# Patient Record
Sex: Female | Born: 1965
Health system: Southern US, Community
[De-identification: ages and names within clinical notes are randomized; demographics above are authoritative.]

## PROBLEM LIST (undated history)

## (undated) DIAGNOSIS — K219 Gastro-esophageal reflux disease without esophagitis: Secondary | ICD-10-CM

## (undated) DIAGNOSIS — N390 Urinary tract infection, site not specified: Secondary | ICD-10-CM

## (undated) DIAGNOSIS — I499 Cardiac arrhythmia, unspecified: Secondary | ICD-10-CM

## (undated) DIAGNOSIS — T7840XA Allergy, unspecified, initial encounter: Secondary | ICD-10-CM

## (undated) DIAGNOSIS — J309 Allergic rhinitis, unspecified: Secondary | ICD-10-CM

## (undated) DIAGNOSIS — I10 Essential (primary) hypertension: Secondary | ICD-10-CM

## (undated) DIAGNOSIS — B019 Varicella without complication: Secondary | ICD-10-CM

## (undated) HISTORY — DX: Urinary tract infection, site not specified: N39.0

## (undated) HISTORY — DX: Allergic rhinitis, unspecified: J30.9

## (undated) HISTORY — DX: Cardiac arrhythmia, unspecified: I49.9

## (undated) HISTORY — DX: Gastro-esophageal reflux disease without esophagitis: K21.9

## (undated) HISTORY — DX: Varicella without complication: B01.9

## (undated) HISTORY — DX: Allergy, unspecified, initial encounter: T78.40XA

## (undated) HISTORY — DX: Essential (primary) hypertension: I10

---
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
## 2010-12-27 DIAGNOSIS — I499 Cardiac arrhythmia, unspecified: Secondary | ICD-10-CM

## 2010-12-27 HISTORY — DX: Cardiac arrhythmia, unspecified: I49.9

## 2011-09-23 ENCOUNTER — Ambulatory Visit: Payer: Self-pay

## 2012-08-30 HISTORY — PX: INTRAUTERINE DEVICE (IUD) INSERTION: SHX5877

## 2015-12-28 HISTORY — PX: BREAST BIOPSY: SHX20

## 2016-04-26 ENCOUNTER — Ambulatory Visit
Admission: RE | Admit: 2016-04-26 | Discharge: 2016-04-26 | Disposition: A | Discharge status: Home / Self Care | Payer: BLUE CROSS/BLUE SHIELD | Source: Ambulatory Visit | Attending: Family Medicine | Admitting: Family Medicine

## 2016-04-26 ENCOUNTER — Other Ambulatory Visit: Payer: Self-pay | Admitting: Family Medicine

## 2016-04-26 DIAGNOSIS — R062 Wheezing: Secondary | ICD-10-CM

## 2016-04-26 DIAGNOSIS — R05 Cough: Secondary | ICD-10-CM

## 2016-04-26 DIAGNOSIS — R059 Cough, unspecified: Secondary | ICD-10-CM

## 2016-04-26 DIAGNOSIS — R0602 Shortness of breath: Secondary | ICD-10-CM

## 2016-04-26 DIAGNOSIS — R918 Other nonspecific abnormal finding of lung field: Secondary | ICD-10-CM | POA: Insufficient documentation

## 2016-04-29 ENCOUNTER — Ambulatory Visit: Payer: BLUE CROSS/BLUE SHIELD | Admitting: Family Medicine

## 2016-04-30 ENCOUNTER — Encounter: Payer: Self-pay | Admitting: Family Medicine

## 2016-04-30 ENCOUNTER — Ambulatory Visit (INDEPENDENT_AMBULATORY_CARE_PROVIDER_SITE_OTHER): Payer: BLUE CROSS/BLUE SHIELD | Admitting: Family Medicine

## 2016-04-30 VITALS — BP 124/92 | HR 81 | Temp 98.5°F | Ht 65.25 in | Wt 168.2 lb

## 2016-04-30 DIAGNOSIS — J189 Pneumonia, unspecified organism: Secondary | ICD-10-CM | POA: Diagnosis not present

## 2016-04-30 MED ORDER — BECLOMETHASONE DIPROPIONATE 80 MCG/ACT IN AERS: 1.0000 | INHALATION_SPRAY | Freq: Two times a day (BID) | RESPIRATORY_TRACT | Status: DC

## 2016-04-30 MED ORDER — NITROFURANTOIN MONOHYD MACRO 100 MG PO CAPS: ORAL_CAPSULE | ORAL | Status: DC

## 2016-04-30 MED ORDER — AZITHROMYCIN 250 MG PO TABS: ORAL_TABLET | ORAL | Status: DC

## 2016-04-30 NOTE — Assessment & Plan Note (Signed)
New problem. Unclear etiology. Suspect viral illness. Given lack of improvement, starting on inhaled corticosteroid. Rx for Qvar sent. If fails to improve, she can start course of Azithromycin. If persists will need PFT's and possible Pulm referral.

## 2016-04-30 NOTE — Patient Instructions (Signed)
Use the inhaler as prescribed.  If you fail to improve within the next week take the antibiotic.  Call if you worsen or fail to improve.  Follow up annually or sooner if needed.  Take care  Dr. Lacinda Axon  Pneumonitis Pneumonitis is inflammation of the lungs.  CAUSES  Many things can cause pneumonitis. These can include:   A bacterial or viral infection. Pneumonitis due to an infection is usually called pneumonia.  Work-related exposures, including farm and industrial work. Some substances that can cause pneumonitis include asbestos, silica, inhaled acids, or inhaled chlorine gas.   Repeated exposure to bird feathers, bird feces, or other allergens.   Medicine such as chemotherapy drugs, certain antibiotics, and some heart medicines.   Radiation therapy.   Exposure to mold. A hot tub, sauna, or home humidifier can have mold growing in it, even if it looks clean. The mold can be breathed in through water vapor.  Breathing (aspirating) stomach contents, food, or liquids into the lungs.  SIGNS AND SYMPTOMS   Cough.   Shortness of breath or difficulty breathing.   Fever.   Decreased energy.   Decreased appetite.  DIAGNOSIS  To diagnose pneumonitis, your health care provider will do a complete history and physical exam. Various tests may be ordered, such as:   Pulmonary function test.   Chest X-ray.   CT scan of the lungs.   Bronchoscopy.   Lung biopsy.  TREATMENT  Treatment will depend on the cause of the pneumonitis. If the cause is exposure to a substance, avoiding further exposure to that substance will help reduce your symptoms. Possible medical treatments for pneumonitis include:   Corticosteroid medicine to help decrease inflammation in the lungs.   Antibiotic medicine to help fight a bacterial lung infection.   Oxygen therapy if you are having difficulty breathing.  HOME CARE INSTRUCTIONS   Avoid exposure to any substance identified as the  cause of your pneumonitis.   If you must continue to work with substances that can cause pneumonitis, wear a mask to protect your lungs.   Only take over-the-counter or prescription medicine as directed by your health care provider.   Do not smoke.   If you use inhalers, keep them with you at all times.   Follow up with your health care provider as directed.  SEEK IMMEDIATE MEDICAL CARE IF:   You develop new or increased shortness of breath.   You develop a blue color (cyanosis) under your fingernails.   You have a fever.  MAKE SURE YOU:   Understand these instructions.  Will watch your condition.  Will get help right away if you are not doing well or get worse.   This information is not intended to replace advice given to you by your health care provider. Make sure you discuss any questions you have with your health care provider.   Document Released: 06/02/2010 Document Revised: 08/15/2013 Document Reviewed: 06/04/2013 Elsevier Interactive Patient Education [employer].

## 2016-04-30 NOTE — Progress Notes (Signed)
Pre visit review using our clinic review tool, if applicable. No additional management support is needed unless otherwise documented below in the visit note. 

## 2016-04-30 NOTE — Progress Notes (Signed)
Subjective:  Patient ID: Carol Jordan, female    DOB: 1966/10/09  Age: 50 y.o. MRN: [id number]  CC: Cough/pneumonitis  HPI Carol Jordan is a 50 y.o. female presents to the clinic today with the above complaints.   Cough  Patient reports that she has not felt well for the past 2 and half weeks.  She has been experiencing persistent, dry cough.  She reports associated shortness of breath.  Denies fever.  She has been seen at her work and has been treated with prednisone, Augmentin, albuterol, and Tussionex.  Additionally, she's recently had a chest x-ray which revealed pneumonitis.  She presented today with complaints of continued cough. She states that the cough is severe and unrelenting. She's had no relief with the above treatment.  No known exacerbating factors.  No other complaints at this time.  PMH, Surgical Hx, Family Hx, Social History reviewed and updated as below.  Past Medical History  Diagnosis Date  . Chicken pox   . UTI (urinary tract infection)   . Allergy   . GERD (gastroesophageal reflux disease)    Past Surgical History  Procedure Laterality Date  . Hernia repair  1972   Family History  Problem Relation Age of Onset  . Heart disease Mother   . Hypertension Mother   . Stroke Maternal Grandmother    Social History  Substance Use Topics  . Smoking status: Never Smoker   . Smokeless tobacco: Never Used  . Alcohol Use: 0.0 - 0.6 oz/week    0-1 Standard drinks or equivalent per week   Review of Systems  Respiratory: Positive for cough, chest tightness and shortness of breath.   Genitourinary:       Incontinence.  All other systems reviewed and are negative.  Objective:   Today's Vitals: BP 124/92 mmHg  Pulse 81  Temp(Src) 98.5 F (36.9 C) (Oral)  Ht 5' 5.25" (1.657 m)  Wt 168 lb 4 oz (76.318 kg)  BMI 27.80 kg/m2  SpO2 92%  LMP 04/25/2016  Physical Exam  Constitutional: She is oriented to person, place, and time. She appears  well-developed. No distress.  HENT:  Head: Normocephalic and atraumatic.  Mouth/Throat: Oropharynx is clear and moist. No oropharyngeal exudate.  Normal TM's bilaterally.   Eyes: Conjunctivae are normal. No scleral icterus.  Neck: Neck supple.  Cardiovascular: Normal rate and regular rhythm.   No murmur heard. Pulmonary/Chest: Effort normal.  Diffuse wheezing noted. Crackles also noted.  Abdominal: Soft. She exhibits no distension.  Musculoskeletal: Normal range of motion. She exhibits no edema.  Lymphadenopathy:    She has no cervical adenopathy.  Neurological: She is alert and oriented to person, place, and time.  Skin: No rash noted.  Scar noted on left knee. Chest - hypertrophic scar vs cheloid.   Psychiatric: She has a normal mood and affect.  Vitals reviewed.  Assessment & Plan:   Problem List Items Addressed This Visit    Pneumonitis - Primary    New problem. Unclear etiology. Suspect viral illness. Given lack of improvement, starting on inhaled corticosteroid. Rx for Qvar sent. If fails to improve, she can start course of Azithromycin. If persists will need PFT's and possible Pulm referral.        Outpatient Encounter Prescriptions as of 04/30/2016  Medication Sig  . nitrofurantoin, macrocrystal-monohydrate, (MACROBID) 100 MG capsule PRN after intercourse.  . [DISCONTINUED] Fexofenadine HCl (ALLEGRA PO) Take by mouth.  . [DISCONTINUED] Nitrofurantoin Monohyd Macro (MACROBID PO) Take by mouth as needed.  Marland Kitchen  azithromycin (ZITHROMAX) 250 MG tablet 2 tablets on day 1; then 1 tablet daily on days 2-5.  . beclomethasone (QVAR) 80 MCG/ACT inhaler Inhale 1 puff into the lungs 2 (two) times daily.  . fexofenadine (ALLEGRA) 180 MG tablet TAKE 1 TABLET (180 MG) BY ORAL ROUTE ONCE DAILY  . predniSONE (STERAPRED UNI-PAK 21 TAB) 10 MG (21) TBPK tablet See admin instructions.  Marland Kitchen PROAIR HFA 108 (90 Base) MCG/ACT inhaler INHALE 2 PUFFS (180 MCG) BY INHALATION ROUTE EVERY 4-6 HOURS AS  NEEDED   No facility-administered encounter medications on file as of 04/30/2016.   Follow-up: Annually or sooner if needed  [place]

## 2016-05-17 ENCOUNTER — Telehealth: Payer: Self-pay | Admitting: Family Medicine

## 2016-05-17 ENCOUNTER — Ambulatory Visit (INDEPENDENT_AMBULATORY_CARE_PROVIDER_SITE_OTHER): Payer: BLUE CROSS/BLUE SHIELD | Admitting: Family Medicine

## 2016-05-17 ENCOUNTER — Encounter: Payer: Self-pay | Admitting: Family Medicine

## 2016-05-17 VITALS — BP 142/98 | HR 93 | Temp 98.4°F | Ht 65.5 in | Wt 169.4 lb

## 2016-05-17 DIAGNOSIS — R053 Chronic cough: Secondary | ICD-10-CM

## 2016-05-17 DIAGNOSIS — T148 Other injury of unspecified body region: Secondary | ICD-10-CM

## 2016-05-17 DIAGNOSIS — R05 Cough: Secondary | ICD-10-CM | POA: Diagnosis not present

## 2016-05-17 DIAGNOSIS — W57XXXA Bitten or stung by nonvenomous insect and other nonvenomous arthropods, initial encounter: Secondary | ICD-10-CM | POA: Diagnosis not present

## 2016-05-17 MED ORDER — DOXYCYCLINE HYCLATE 100 MG PO TABS: 100.0000 mg | ORAL_TABLET | Freq: Two times a day (BID) | ORAL | Status: DC

## 2016-05-17 NOTE — Patient Instructions (Signed)
Take the doxy as prescribed.  We will call with your referral to pulm.  Take care  Dr. Lacinda Axon

## 2016-05-17 NOTE — Telephone Encounter (Signed)
[facility] Patient Name: [name] DOB: 02-16-1966 Initial Comment Caller states she has a tick bite on back of neck, got it mostly out, still has a piece in. Also still coughing after pneumonitis. Has coughing after eating. Nurse Assessment Nurse: Dimas Chyle, RN, Dellis Filbert Date/Time Eilene Ghazi Time): 05/17/2016 9:20:51 AM Confirm and document reason for call. If symptomatic, describe symptoms. You must click the next button to save text entered. ---Caller states she has a tick bite on back of neck, got it mostly out, still has a piece in. Also still coughing after pneumonitis. Has coughing after eating. Has had pneumonitis for 4 weeks. Has the patient traveled out of the country within the last 30 days? ---No Does the patient have any new or worsening symptoms? ---Yes Will a triage be completed? ---Yes Related visit to physician within the last 2 weeks? ---Yes Does the PT have any chronic conditions? (i.e. diabetes, asthma, etc.) ---No Is the patient pregnant or possibly pregnant? (Ask all females between the ages of 43-55) ---No Is this a behavioral health or substance abuse call? ---No Guidelines Guideline Title Affirmed Question Affirmed Notes Tick Bite Can't remove tick's head that was broken off in the skin (after trying Care Advice) Pneumonia on Antibiotic Post- Hospitalization Follow-up Call [1] Finished taking antibiotics AND [2] breathing not back to normal (e.g., "breathing still worse than normal") Final Disposition User See Physician within [place], RN, Dellis Filbert Comments Caller was going to call back if unable to get nurse at work to get piece from tick bite out. Referrals REFERRED TO PCP OFFICE Disagree/Comply: Comply Disagree/Comply: Comply

## 2016-05-17 NOTE — Telephone Encounter (Signed)
Patient to comply and appointment scheduled for today with Dr. Lacinda Axon.

## 2016-05-17 NOTE — Assessment & Plan Note (Signed)
New problem. Retained portion of tick removed today. Area was injected with 0.5 mL of lidocaine w/ epi. Scalpel and forceps used to remove retained portion. Given above, treating with Doxy.

## 2016-05-17 NOTE — Progress Notes (Signed)
Pre visit review using our clinic review tool, if applicable. No additional management support is needed unless otherwise documented below in the visit note. 

## 2016-05-17 NOTE — Assessment & Plan Note (Signed)
Patient now with chronic cough. Etiology unclear. Sending to pulm for PFT's and eval.

## 2016-05-17 NOTE — Progress Notes (Signed)
   Subjective:  Patient ID: Carol Jordan, female    DOB: 1966-04-22  Age: 50 y.o. MRN: [id number]  CC: Tick bite, Cough  HPI:  50 year old female presents with the above complaints.  Tick bite  Discovered last night.  Was removed. She feels like there is still a portion in her skin.  No fever, chills. No rash.  Was bit on left upper chest (above clavicle).  No known exacerbating factors.  Cough  Previously seen for this.  Has been treated with 2 rounds of antibiotics as well as Albuterol, Tussionex, QVAR.   Continues to persist.  She has had no improvement/resolution.  No SOB.  No reports of GERD but does report a remote history of this.  No other associated symptoms.  Social Hx   Social History   Social History  . Marital Status: Single    Spouse Name: N/A  . Number of Children: N/A  . Years of Education: N/A   Social History Main Topics  . Smoking status: Never Smoker   . Smokeless tobacco: Never Used  . Alcohol Use: 0.0 - 0.6 oz/week    0-1 Standard drinks or equivalent per week  . Drug Use: None  . Sexual Activity: Not Asked   Other Topics Concern  . None   Social History Narrative   Review of Systems  Constitutional: Negative for fever.  Respiratory: Positive for cough.   Skin:       Tick bite.    Objective:  BP 142/98 mmHg  Pulse 93  Temp(Src) 98.4 F (36.9 C) (Oral)  Ht 5' 5.5" (1.664 m)  Wt 169 lb 6.4 oz (76.839 kg)  BMI 27.75 kg/m2  SpO2 94%  LMP 04/25/2016  BP/Weight 99991111 [id number]  Systolic BP A999333 [id number]  Diastolic BP 98 92  Wt. (Lbs) 169.4 168.25  BMI 27.75 27.8    Physical Exam  Constitutional: She is oriented to person, place, and time. She appears well-developed. No distress.  Cardiovascular: Normal rate and regular rhythm.   Pulmonary/Chest: Effort normal. No respiratory distress. She has no wheezes. She has no rales.  Neurological: She is alert and oriented to person, place, and time.  Skin:  Small area with  dark center; appears to be a retained portion of tick. No surrounding erythema. No drainage.  Psychiatric: She has a normal mood and affect.  Vitals reviewed.   Assessment & Plan:   Problem List Items Addressed This Visit    Tick bite - Primary    New problem. Retained portion of tick removed today. Area was injected with 0.5 mL of lidocaine w/ epi. Scalpel and forceps used to remove retained portion. Given above, treating with Doxy.      Chronic cough    Patient now with chronic cough. Etiology unclear. Sending to pulm for PFT's and eval.      Relevant Orders   Ambulatory referral to Pulmonology      Meds ordered this encounter  Medications  . doxycycline (VIBRA-TABS) 100 MG tablet    Sig: Take 1 tablet (100 mg total) by mouth 2 (two) times daily.    Dispense:  14 tablet    Refill:  0    Follow-up: PRN  [place]

## 2016-05-18 ENCOUNTER — Encounter: Payer: Self-pay | Admitting: Pulmonary Disease

## 2016-05-20 ENCOUNTER — Ambulatory Visit: Payer: Self-pay | Admitting: Family Medicine

## 2016-05-31 ENCOUNTER — Encounter (INDEPENDENT_AMBULATORY_CARE_PROVIDER_SITE_OTHER): Payer: Self-pay

## 2016-05-31 ENCOUNTER — Ambulatory Visit (INDEPENDENT_AMBULATORY_CARE_PROVIDER_SITE_OTHER): Payer: BLUE CROSS/BLUE SHIELD | Admitting: Pulmonary Disease

## 2016-05-31 ENCOUNTER — Encounter: Payer: Self-pay | Admitting: Pulmonary Disease

## 2016-05-31 VITALS — BP 126/74 | HR 90 | Ht 65.5 in | Wt 168.0 lb

## 2016-05-31 DIAGNOSIS — J387 Other diseases of larynx: Secondary | ICD-10-CM | POA: Diagnosis not present

## 2016-05-31 DIAGNOSIS — R05 Cough: Secondary | ICD-10-CM

## 2016-05-31 DIAGNOSIS — J302 Other seasonal allergic rhinitis: Secondary | ICD-10-CM | POA: Diagnosis not present

## 2016-05-31 DIAGNOSIS — J45909 Unspecified asthma, uncomplicated: Secondary | ICD-10-CM

## 2016-05-31 DIAGNOSIS — K219 Gastro-esophageal reflux disease without esophagitis: Secondary | ICD-10-CM

## 2016-05-31 DIAGNOSIS — R059 Cough, unspecified: Secondary | ICD-10-CM

## 2016-05-31 MED ORDER — BUDESONIDE-FORMOTEROL FUMARATE 160-4.5 MCG/ACT IN AERO: 2.0000 | INHALATION_SPRAY | Freq: Two times a day (BID) | RESPIRATORY_TRACT | Status: DC

## 2016-05-31 MED ORDER — PANTOPRAZOLE SODIUM 40 MG PO TBEC: 40.0000 mg | DELAYED_RELEASE_TABLET | Freq: Every day | ORAL | Status: DC

## 2016-06-01 NOTE — Progress Notes (Signed)
PULMONARY CONSULT NOTE  Requesting MD/Service: Thersa Salt, MD Date of initial consultation: 05/31/16 Reason for consultation: cough  PT PROFILE: 50 y.o. F for subacute cough due to rhinitis, suspected LPR and possible asthma  HPI:  35 F referred byDr Lacinda Axon for evaluation of cough X 2-3 months duration. At the onset of this problem she was diagnosed with a "sinus infection" when she presented with sinus pressure, ST, HA and hoarseness. She was treated with antibiotics and prednisone with improvement in most symptoms but persistent cough. She has subsequently been started on Qvar inhaler. Presently, she reports persistent daily cough which is nonproductive. She is not able to identify any specific triggers. She also notes a hoarse voice qulaity. She has no prior dx of asthma. The Qvar has been modestly beneficial at most. She notes a prior history of seasonal allergies. She denies CP, fever, purulent sputum, hemoptysis, LE edema and calf tenderness.   Past Medical History  Diagnosis Date  . Chicken pox   . UTI (urinary tract infection)   . Allergy   . GERD (gastroesophageal reflux disease)     Past Surgical History  Procedure Laterality Date  . Hernia repair  1972    MEDICATIONS: I have reviewed all medications and confirmed regimen as documented  Social History   Social History  . Marital Status: Single    Spouse Name: N/A  . Number of Children: N/A  . Years of Education: N/A   Occupational History  . Not on file.   Social History Main Topics  . Smoking status: Never Smoker   . Smokeless tobacco: Never Used  . Alcohol Use: 0.0 - 0.6 oz/week    0-1 Standard drinks or equivalent per week  . Drug Use: No  . Sexual Activity: Not on file   Other Topics Concern  . Not on file   Social History Narrative    Family History  Problem Relation Age of Onset  . Heart disease Mother   . Hypertension Mother   . Stroke Maternal Grandmother     ROS: No fever,  myalgias/arthralgias, unexplained weight loss or weight gain No new focal weakness or sensory deficits No otalgia, hearing loss, visual changes, nasal and sinus symptoms, mouth and throat problems No neck pain or adenopathy No abdominal pain, N/V/D, diarrhea, change in bowel pattern No dysuria, change in urinary pattern   Filed Vitals:   05/31/16 1031  BP: 126/74  Pulse: 90  Height: 5' 5.5" (1.664 m)  Weight: 168 lb (76.204 kg)  SpO2: 98%     EXAM:  Gen: WDWN, No overt respiratory distress HEENT: NCAT, TMs and canals normal, sclera white, nares and nasal mucosa mildly normal, oropharynx normal Neck: Supple without LAN, thyromegaly, JVD Lungs: breath sounds full, percussion note, No adventitious sounds Cardiovascular: Normal rate, reg rhythm, no murmurs noted Abdomen: Soft, nontender, normal BS Ext: without clubbing, cyanosis, edema Neuro: CNs grossly intact, motor and sensory intact Skin: Limited exam, no lesions noted  DATA:   No flowsheet data found.  No flowsheet data found.  CXR (04/26/16): official interpretation: mild interstitial prominence. My interpretation: normal    IMPRESSION:     ICD-9-CM ICD-10-CM   1. Cough 786.2 R05 Pulmonary function test  2. Seasonal allergies 477.9 J30.2   3. LPR, suspected 478.79 J38.7   4. Asthma, possible 493.90 J45.909 Pulmonary function test     PLAN:  1) change Qvar to Symbicort 2) Trial of PPI - pantoprazole 40 mg daily 3) Continue Allegra daily 4) ROV  4-6 weeks with PFTs prior  Merton Border, MD PCCM service Mobile [phone] Pager [phone] 06/01/2016

## 2016-06-17 ENCOUNTER — Other Ambulatory Visit: Payer: Self-pay | Admitting: Certified Nurse Midwife

## 2016-06-17 DIAGNOSIS — N6489 Other specified disorders of breast: Secondary | ICD-10-CM

## 2016-06-22 ENCOUNTER — Other Ambulatory Visit: Payer: Self-pay | Admitting: *Deleted

## 2016-06-22 ENCOUNTER — Inpatient Hospital Stay
Admission: RE | Admit: 2016-06-22 | Discharge: 2016-06-22 | Disposition: A | Discharge status: Home / Self Care | Payer: Self-pay | Source: Ambulatory Visit | Attending: *Deleted | Admitting: *Deleted

## 2016-06-22 DIAGNOSIS — Z9289 Personal history of other medical treatment: Secondary | ICD-10-CM

## 2016-07-09 ENCOUNTER — Encounter: Payer: Self-pay | Admitting: Family Medicine

## 2016-07-09 ENCOUNTER — Ambulatory Visit (INDEPENDENT_AMBULATORY_CARE_PROVIDER_SITE_OTHER): Payer: BLUE CROSS/BLUE SHIELD | Admitting: *Deleted

## 2016-07-09 DIAGNOSIS — J45909 Unspecified asthma, uncomplicated: Secondary | ICD-10-CM

## 2016-07-09 DIAGNOSIS — R059 Cough, unspecified: Secondary | ICD-10-CM

## 2016-07-09 DIAGNOSIS — R05 Cough: Secondary | ICD-10-CM

## 2016-07-09 LAB — PULMONARY FUNCTION TEST
DL/VA % pred: 100 %
DL/VA: 5.04 ml/min/mmHg/L
DLCO unc % pred: 96 %
DLCO unc: 25.95 ml/min/mmHg
FEF 25-75 PRE: 3.28 L/s
FEF 25-75 Post: 3.12 L/sec
FEF2575-%CHANGE-POST: -4 %
FEF2575-%PRED-POST: 110 %
FEF2575-%Pred-Pre: 115 %
FEV1-%CHANGE-POST: -2 %
FEV1-%PRED-POST: 102 %
FEV1-%PRED-PRE: 104 %
FEV1-POST: 3.04 L
FEV1-PRE: 3.1 L
FEV1FVC-%CHANGE-POST: -1 %
FEV1FVC-%PRED-PRE: 103 %
FEV6-%Change-Post: 0 %
FEV6-%PRED-POST: 101 %
FEV6-%Pred-Pre: 101 %
FEV6-PRE: 3.73 L
FEV6-Post: 3.7 L
FEV6FVC-%PRED-PRE: 103 %
FEV6FVC-%Pred-Post: 103 %
FVC-%CHANGE-POST: 0 %
FVC-%PRED-POST: 98 %
FVC-%PRED-PRE: 99 %
FVC-POST: 3.7 L
FVC-PRE: 3.73 L
POST FEV1/FVC RATIO: 82 %
PRE FEV6/FVC RATIO: 100 %
Post FEV6/FVC ratio: 100 %
Pre FEV1/FVC ratio: 83 %
RV % PRED: 87 %
RV: 1.66 L
TLC % pred: 102 %
TLC: 5.46 L

## 2016-07-09 NOTE — Progress Notes (Signed)
PFT performed today. 

## 2016-07-13 ENCOUNTER — Ambulatory Visit
Admission: RE | Admit: 2016-07-13 | Discharge: 2016-07-13 | Disposition: A | Discharge status: Home / Self Care | Payer: BLUE CROSS/BLUE SHIELD | Source: Ambulatory Visit | Attending: Certified Nurse Midwife | Admitting: Certified Nurse Midwife

## 2016-07-13 DIAGNOSIS — N6489 Other specified disorders of breast: Secondary | ICD-10-CM

## 2016-07-13 DIAGNOSIS — N63 Unspecified lump in breast: Secondary | ICD-10-CM | POA: Insufficient documentation

## 2016-07-14 ENCOUNTER — Ambulatory Visit: Payer: BLUE CROSS/BLUE SHIELD | Admitting: Pulmonary Disease

## 2016-07-16 ENCOUNTER — Other Ambulatory Visit: Payer: Self-pay | Admitting: Certified Nurse Midwife

## 2016-07-16 DIAGNOSIS — N6320 Unspecified lump in the left breast, unspecified quadrant: Secondary | ICD-10-CM

## 2016-07-22 ENCOUNTER — Encounter: Payer: Self-pay | Admitting: Pulmonary Disease

## 2016-07-22 ENCOUNTER — Ambulatory Visit (INDEPENDENT_AMBULATORY_CARE_PROVIDER_SITE_OTHER): Payer: BLUE CROSS/BLUE SHIELD | Admitting: Pulmonary Disease

## 2016-07-22 VITALS — BP 122/70 | HR 74 | Ht 66.0 in | Wt 170.0 lb

## 2016-07-22 DIAGNOSIS — R05 Cough: Secondary | ICD-10-CM

## 2016-07-22 DIAGNOSIS — R059 Cough, unspecified: Secondary | ICD-10-CM

## 2016-07-24 NOTE — Progress Notes (Signed)
PULMONARY OFFICE FOLLOW UP NOTE  Requesting MD/Service: Thersa Salt, MD Date of initial consultation: 05/31/16 Reason for consultation: cough  PT PROFILE: 50 y.o. F for subacute cough due to rhinitis, suspected LPR and possible asthma.  INITIAL PLAN: 1) change Qvar to Symbicort 2) Trial of PPI - pantoprazole 40 mg daily 3) Continue Allegra daily 4) ROV 4-6 weeks with PFTs prior DATA: CXR 05/01/16: normal PFTs 0714/17: normal spirometry, lung volumes and DLCO  ROV 0726/17:  Improved cough, not resolved. Continue same. Follow up in 6-8 weeks   SUBJ: Cough is much improved but not totally resolved. She does not have cough every day. Cough is no longer awakening her @ night. She still has cough when talking a lot. Denies dyspnea, CP, fever, purulent sputum, hemoptysis, LE edema and calf tenderness  OBJ: Vitals:   07/22/16 0919  BP: 122/70  Pulse: 74  SpO2: 97%  Weight: 170 lb (77.1 kg)  Height: 5\' 6"  (1.676 m)  Room air   EXAM:  Gen: No overt distress HEENT: NCAT, sclera white, oropharynx normal Neck: Supple without LAN, thyromegaly, JVD Lungs: breath sounds full, No adventitious sounds Cardiovascular: Normal rate, reg rhythm, no murmurs Abdomen: Soft, nontender, normal BS Ext: without clubbing, cyanosis, edema Neuro: grossly intact  DATA:   No flowsheet data found.  No flowsheet data found.  CXR: NNF    IMPRESSION:     ICD-9-CM ICD-10-CM   1. Cough 786.2 R05    Exact etiology unclear. She is better on PPI, ICS/LABA, Antihistamine. It is not clear which of these has contributed to the improvement. Given improvement, we will cont same for now  PLAN:  Cont Symbicort, Allegra, Pantoprazole ROV @ 6-8 weeks at which time, we will discuss sequentially dropping medications  Merton Border, MD PCCM service Mobile [phone] Pager [phone] 07/24/2016

## 2016-08-02 ENCOUNTER — Ambulatory Visit
Admission: RE | Admit: 2016-08-02 | Discharge: 2016-08-02 | Disposition: A | Discharge status: Home / Self Care | Payer: BLUE CROSS/BLUE SHIELD | Source: Ambulatory Visit | Attending: Certified Nurse Midwife | Admitting: Certified Nurse Midwife

## 2016-08-02 DIAGNOSIS — N6320 Unspecified lump in the left breast, unspecified quadrant: Secondary | ICD-10-CM

## 2016-08-02 DIAGNOSIS — N63 Unspecified lump in breast: Secondary | ICD-10-CM

## 2016-08-02 DIAGNOSIS — D242 Benign neoplasm of left breast: Secondary | ICD-10-CM | POA: Insufficient documentation

## 2016-08-02 HISTORY — PX: BREAST SURGERY: SHX581

## 2016-08-03 DIAGNOSIS — D249 Benign neoplasm of unspecified breast: Secondary | ICD-10-CM | POA: Insufficient documentation

## 2016-08-03 LAB — SURGICAL PATHOLOGY

## 2016-09-10 ENCOUNTER — Ambulatory Visit: Payer: BLUE CROSS/BLUE SHIELD | Admitting: Pulmonary Disease

## 2016-10-21 ENCOUNTER — Other Ambulatory Visit: Payer: Self-pay | Admitting: Family Medicine

## 2016-12-09 ENCOUNTER — Other Ambulatory Visit: Payer: Self-pay | Admitting: Pulmonary Disease

## 2017-01-31 ENCOUNTER — Other Ambulatory Visit: Payer: Self-pay | Admitting: Certified Nurse Midwife

## 2017-01-31 DIAGNOSIS — N6310 Unspecified lump in the right breast, unspecified quadrant: Secondary | ICD-10-CM

## 2017-02-15 ENCOUNTER — Ambulatory Visit
Admission: RE | Admit: 2017-02-15 | Discharge: 2017-02-15 | Disposition: A | Discharge status: Home / Self Care | Payer: BLUE CROSS/BLUE SHIELD | Source: Ambulatory Visit | Attending: Certified Nurse Midwife | Admitting: Certified Nurse Midwife

## 2017-02-15 DIAGNOSIS — N6310 Unspecified lump in the right breast, unspecified quadrant: Secondary | ICD-10-CM | POA: Insufficient documentation

## 2017-09-15 ENCOUNTER — Telehealth: Payer: Self-pay | Admitting: Certified Nurse Midwife

## 2017-09-15 NOTE — Telephone Encounter (Signed)
Pt is calling today to find out about her 6 months follow up for her Mammogram. Pt is schedule for annual 09/29/17. Please contact patient

## 2017-09-21 ENCOUNTER — Other Ambulatory Visit: Payer: Self-pay | Admitting: Certified Nurse Midwife

## 2017-09-21 DIAGNOSIS — N6310 Unspecified lump in the right breast, unspecified quadrant: Secondary | ICD-10-CM

## 2017-09-21 DIAGNOSIS — Z1239 Encounter for other screening for malignant neoplasm of breast: Secondary | ICD-10-CM

## 2017-09-21 NOTE — Telephone Encounter (Signed)
Pt calling to follow up about her mammogram. Please advise

## 2017-09-21 NOTE — Telephone Encounter (Signed)
Pt aware and phone # given to contact [facility].

## 2017-09-21 NOTE — Telephone Encounter (Signed)
I put orders in for her mammogram and routed it to [facility]. [facility] should call to schedule or she can call [facility] to schedule since orders are in [phone]

## 2017-09-29 ENCOUNTER — Ambulatory Visit (INDEPENDENT_AMBULATORY_CARE_PROVIDER_SITE_OTHER): Payer: BLUE CROSS/BLUE SHIELD | Admitting: Certified Nurse Midwife

## 2017-09-29 ENCOUNTER — Encounter: Payer: Self-pay | Admitting: Certified Nurse Midwife

## 2017-09-29 VITALS — BP 114/80 | HR 70 | Ht 66.0 in | Wt 161.0 lb

## 2017-09-29 DIAGNOSIS — Z1211 Encounter for screening for malignant neoplasm of colon: Secondary | ICD-10-CM | POA: Diagnosis not present

## 2017-09-29 DIAGNOSIS — N390 Urinary tract infection, site not specified: Secondary | ICD-10-CM | POA: Diagnosis not present

## 2017-09-29 DIAGNOSIS — Z124 Encounter for screening for malignant neoplasm of cervix: Secondary | ICD-10-CM | POA: Diagnosis not present

## 2017-09-29 DIAGNOSIS — J309 Allergic rhinitis, unspecified: Secondary | ICD-10-CM | POA: Insufficient documentation

## 2017-09-29 DIAGNOSIS — N951 Menopausal and female climacteric states: Secondary | ICD-10-CM | POA: Diagnosis not present

## 2017-09-29 DIAGNOSIS — Z01419 Encounter for gynecological examination (general) (routine) without abnormal findings: Secondary | ICD-10-CM | POA: Diagnosis not present

## 2017-09-29 DIAGNOSIS — I499 Cardiac arrhythmia, unspecified: Secondary | ICD-10-CM | POA: Insufficient documentation

## 2017-09-29 MED ORDER — NITROFURANTOIN MONOHYD MACRO 100 MG PO CAPS: ORAL_CAPSULE | ORAL | 5 refills | Status: DC

## 2017-09-29 NOTE — Progress Notes (Signed)
Gynecology Annual Exam  PCP: Coral Spikes, DO  Chief Complaint:  Chief Complaint  Patient presents with  . Gynecologic Exam    History of Present Illness:Carol Jordan is a 51 year old Caucasian/White female , G 1 P 1 0 0 1 , who presents for her annual exam . She is having no significant GYN problems. Her menses are irregular on the Mirena IUD. They occur once every couple months and are very light lasting 1 day.   She denies dysmenorrhea.  The patient's past medical history is notable for a history of frequent UTIs that have been managed with a postcoital dose of Macrobid, a cardiac arrhythmia (no symptoms and no meds), and fibroadenomas of the left breast on biopsies done 2017.   Since her last annual GYN exam dated 08/04/2016, she has been on a low CHO diet and has lost 9#. She is sexually active. She is currently using an IUD for contraception. Mirena IUD was placed on 08/30/2012.  Her most recent pap smear was obtained 08/04/2016 and was with negative cells and negative HPV DNA.  Her most recent bilateral mammogram obtained on 07/13/2016 was Birads 4 with masses seen in both the right and left breasts. The left breast masses were biopsied and found to be fibroadenomas. The mass in the right breast was stable on the FU mammogram done in February 2018 and was probably benign. She has a bilateral diagnostic mammogram scheduled for 10/07/2017. There is no family history of breast cancer.  There is no family history of ovarian cancer.  The patient does do monthly self breast exams.  The patient does not smoke.  The patient does not drink alcohol.  The patient does not use illegal drugs.  The patient exercises three times a week.  The patient does get adequate calcium in her diet.  She had a recent cholesterol screen in 2018 at work that was normal.    The patient denies current symptoms of depression.    Review of Systems: Review of Systems  Constitutional: Negative for chills,  fever and weight loss.  HENT: Negative for congestion, sinus pain and sore throat.   Eyes: Negative for blurred vision and pain.  Respiratory: Negative for hemoptysis, shortness of breath and wheezing.   Cardiovascular: Negative for chest pain, palpitations and leg swelling.  Gastrointestinal: Negative for abdominal pain, blood in stool, diarrhea, heartburn, nausea and vomiting.  Genitourinary: Negative for dysuria, frequency, hematuria and urgency.  Musculoskeletal: Negative for back pain, joint pain and myalgias.  Skin: Negative for itching and rash.  Neurological: Negative for dizziness, tingling and headaches.  Endo/Heme/Allergies: Negative for environmental allergies and polydipsia. Does not bruise/bleed easily.       Negative for hirsutism   Psychiatric/Behavioral: Negative for depression. The patient is not nervous/anxious and does not have insomnia.     Past Medical History:  Past Medical History:  Diagnosis Date  . Allergic rhinitis   . Allergy   . Cardiac arrhythmia 2012   work up by cardiology (sinus arrhythmia and bigeminy)  . Chicken pox   . GERD (gastroesophageal reflux disease)   . UTI (urinary tract infection)     Past Surgical History:  Past Surgical History:  Procedure Laterality Date  . BREAST SURGERY Left 08/02/2016   fibroadenomas  . [place]  . INTRAUTERINE DEVICE (IUD) INSERTION  08/30/2012   mirena [facility]    Family History:  Family History  Problem Relation Age of Onset  . Heart  disease Mother   . Hypertension Mother   . Stroke Maternal Grandmother   . Parkinson's disease Maternal Grandmother     Social History:  Social History   Social History  . Marital status: Divorced    Spouse name: N/A  . Number of children: 1  . Years of education: N/A   Occupational History  . cost accountant    Social History Main Topics  . Smoking status: Never Smoker  . Smokeless tobacco: Never Used  . Alcohol use No  . Drug use: No  . Sexual  activity: Yes    Partners: Male    Birth control/ protection: IUD   Other Topics Concern  . Not on file   Social History Narrative  . No narrative on file    Allergies:  No Known Allergies  Medications:  Current Outpatient Prescriptions:  .  fexofenadine (ALLEGRA) 180 MG tablet, TAKE 1 TABLET (180 MG) BY ORAL ROUTE ONCE DAILY, Disp: , Rfl: 3 .  fluticasone (FLONASE) 50 MCG/ACT nasal spray, SPRAY 2 SPRAYS IN EACH NOSTRIL ONCE DAILY, Disp: , Rfl: 4 .  levonorgestrel (MIRENA) 20 MCG/24HR IUD, 1 each by Intrauterine route once., Disp: , Rfl:  .  nitrofurantoin, macrocrystal-monohydrate, (MACROBID) 100 MG capsule, Take one capsule PRN after intercourse., Disp: 30 capsule, Rfl: 5 .  PROAIR HFA 108 (90 Base) MCG/ACT inhaler, Reported on 05/17/2016, Disp: , Rfl: 1 Physical Exam Vitals: BP 114/80   Pulse 70   Ht 5\' 6"  (1.676 m)   Wt 161 lb (73 kg)   LMP 08/17/2017 (Exact Date)   BMI 25.99 kg/m   General: WF in NAD HEENT: normocephalic, anicteric Neck: no thyroid enlargement, no palpable nodules, no cervical lymphadenopathy  Pulmonary: No increased work of breathing, CTAB Cardiovascular: normal rate, but irregular rhythm, without murmur  Breast: Breast symmetrical, no tenderness, no palpable nodules or masses, no skin or nipple retraction present, no nipple discharge.  No axillary, infraclavicular or supraclavicular lymphadenopathy. Abdomen: Soft, non-tender, non-distended.  Umbilicus without lesions.  No hepatomegaly or masses palpable. No evidence of hernia. Genitourinary:  External: Normal external female genitalia.  Normal urethral meatus, normal Bartholin's and Skene's glands.    Vagina: Normal vaginal mucosa, no evidence of prolapse.    Cervix: Grossly normal in appearance, no bleeding, non-tender, IUD strings seen just inside cervical os  Uterus: Retroverted, normal size, shape, and consistency, mobile, and non-tender  Adnexa: No adnexal masses, non-tender  Rectal: no masses,  hemoccult (FIT) negative.  Lymphatic: no evidence of inguinal lymphadenopathy Extremities: no edema, erythema, or tenderness Neurologic: Grossly intact Psychiatric: mood appropriate, affect full     Assessment: 51 y.o. annual gyn exam. History of recurrent UTIs postcoitally Expiring Mirena IUD Cardiac arrhythmia-asymptomatic  Plan:   1) Breast cancer screening - recommend monthly self breast exam and annual screening mammograms if the next mammogram shows no changes Diagnostic bilateral mammogram and right ultrasound scheduled for 10/07/2017  2) Colon cancer screening: discussed options and patient wishes to do in office FIT test yearly.    3) Cervical cancer screening - Pap was done.   4) Contraception - her current IUD has been in place 5 years. Will get [place] and LH drawn, and if elevated levels, will keep current IUD in place for another year. If FSH and LH are normal, recommend replacing her IUD.  5) Routine healthcare maintenance including cholesterol and diabetes screening UTD.   6) Refills of Macrobid sent to pharmacy. Patient has tried to not take the Macrobid after IC  but continues to have UTI sx postcoitally. Considering urology appointment to discuss this and urinary frequency (does drink a large amt of water).  7) RTO in 1 year  Dalia Heading, [place]     Addendum: [facility] and LH were normal. Left message for patient to schedule Mirena replacement 09/30/2017 Dalia Heading, CNM

## 2017-09-30 ENCOUNTER — Telehealth: Payer: Self-pay | Admitting: Certified Nurse Midwife

## 2017-09-30 ENCOUNTER — Encounter: Payer: Self-pay | Admitting: Certified Nurse Midwife

## 2017-09-30 LAB — IGP,RFX APTIMA HPV ALL PTH: PAP SMEAR COMMENT: 0

## 2017-09-30 LAB — HEMOCCULT GUIAC POC 1CARD (OFFICE): Fecal Occult Blood, POC: NEGATIVE

## 2017-09-30 LAB — FSH/LH
FSH: 10.7 m[IU]/mL
LH: 12.5 m[IU]/mL

## 2017-09-30 NOTE — Telephone Encounter (Signed)
Called patient and left message regarding [place] and LH levels being normal and my recommendations to get her Mirena IUD replaced. Asked her to call to schedule an appointment.

## 2017-10-07 ENCOUNTER — Encounter (HOSPITAL_COMMUNITY): Payer: Self-pay

## 2017-10-07 ENCOUNTER — Ambulatory Visit
Admission: RE | Admit: 2017-10-07 | Discharge: 2017-10-07 | Disposition: A | Discharge status: Home / Self Care | Payer: BLUE CROSS/BLUE SHIELD | Source: Ambulatory Visit | Attending: Certified Nurse Midwife | Admitting: Certified Nurse Midwife

## 2017-10-07 DIAGNOSIS — N6310 Unspecified lump in the right breast, unspecified quadrant: Secondary | ICD-10-CM

## 2017-10-07 DIAGNOSIS — D241 Benign neoplasm of right breast: Secondary | ICD-10-CM | POA: Insufficient documentation

## 2017-10-07 DIAGNOSIS — Z1231 Encounter for screening mammogram for malignant neoplasm of breast: Secondary | ICD-10-CM | POA: Diagnosis not present

## 2017-10-07 DIAGNOSIS — Z1239 Encounter for other screening for malignant neoplasm of breast: Secondary | ICD-10-CM

## 2018-08-09 ENCOUNTER — Ambulatory Visit: Payer: BLUE CROSS/BLUE SHIELD

## 2018-08-09 ENCOUNTER — Encounter: Payer: Self-pay | Admitting: Podiatry

## 2018-08-09 ENCOUNTER — Ambulatory Visit (INDEPENDENT_AMBULATORY_CARE_PROVIDER_SITE_OTHER): Payer: BLUE CROSS/BLUE SHIELD

## 2018-08-09 ENCOUNTER — Ambulatory Visit (INDEPENDENT_AMBULATORY_CARE_PROVIDER_SITE_OTHER): Payer: BLUE CROSS/BLUE SHIELD | Admitting: Podiatry

## 2018-08-09 VITALS — BP 136/85 | HR 69 | Resp 16

## 2018-08-09 DIAGNOSIS — M2011 Hallux valgus (acquired), right foot: Secondary | ICD-10-CM

## 2018-08-09 DIAGNOSIS — M2012 Hallux valgus (acquired), left foot: Secondary | ICD-10-CM | POA: Diagnosis not present

## 2018-08-09 DIAGNOSIS — S9032XA Contusion of left foot, initial encounter: Secondary | ICD-10-CM | POA: Diagnosis not present

## 2018-08-09 NOTE — Progress Notes (Signed)
Subjective:  Patient ID: Carol Jordan, female    DOB: 1966/11/16,  MRN: [id number] HPI Chief Complaint  Patient presents with  . Foot Injury    Medial foot to dorsal midfoot left - tripped and fell and hit foot x 10 days ago, initially very bruised and swollen, still swelling quite a bit and sore, tried ice  . Foot Pain    Evaluate bunions bilateral     52 y.o. female presents with the above complaint.   ROS: She denies fever chills nausea vomiting muscle aches pains calf pain back pain chest pain shortness of breath and headache.  Past Medical History:  Diagnosis Date  . Allergic rhinitis   . Allergy   . Cardiac arrhythmia 2012   work up by cardiology (sinus arrhythmia and bigeminy)  . Chicken pox   . GERD (gastroesophageal reflux disease)   . UTI (urinary tract infection)    Past Surgical History:  Procedure Laterality Date  . BREAST BIOPSY Left 2017   neg  . BREAST SURGERY Left 08/02/2016   fibroadenomas  . [place]  . INTRAUTERINE DEVICE (IUD) INSERTION  08/30/2012   mirena [facility]    Current Outpatient Medications:  .  levonorgestrel (MIRENA) 20 MCG/24HR IUD, 1 each by Intrauterine route once., Disp: , Rfl:  .  fexofenadine (ALLEGRA) 180 MG tablet, TAKE 1 TABLET (180 MG) BY ORAL ROUTE ONCE DAILY, Disp: , Rfl: 3  No Known Allergies Review of Systems Objective:   Vitals:   08/09/18 1407  BP: 136/85  Pulse: 69  Resp: 16    General: Well developed, nourished, in no acute distress, alert and oriented x3   Dermatological: Skin is warm, dry and supple bilateral. Nails x 10 are well maintained; remaining integument appears unremarkable at this time. There are no open sores, no preulcerative lesions, no rash or signs of infection present.  Vascular: Dorsalis Pedis artery and Posterior Tibial artery pedal pulses are 2/4 bilateral with immedate capillary fill time. Pedal hair growth present. No varicosities and no lower extremity edema present bilateral.    Neruologic: Grossly intact via light touch bilateral. Vibratory intact via tuning fork bilateral. Protective threshold with Semmes Wienstein monofilament intact to all pedal sites bilateral. Patellar and Achilles deep tendon reflexes 2+ bilateral. No Babinski or clonus noted bilateral.   Musculoskeletal: No gross boney pedal deformities bilateral. No pain, crepitus, or limitation noted with foot and ankle range of motion bilateral. Muscular strength 5/5 in all groups tested bilateral.  Hallux abductovalgus deformity right greater than left which appears to be tracking but not yet track bound.  Hammertoe deformity second right dislocated at the level of the second metatarsal phalangeal joint.  Is rigid at the level of the PIPJ.  Left foot does demonstrate some mild swelling dorsal aspect.  No tenderness on palpation of the metatarsals.  She does have majority of her tenderness associated overlying the navicular tuberosity with no ecchymosis no erythema cellulitis drainage or odor.  Gait: Unassisted, Nonantalgic.    Radiographs:  Hallux abductovalgus deformities bilateral no acute findings to the left foot with exception of what appears to be a possible fracture of the navicular tuberosity nondisplaced non-comminuted more like a stress fracture.  No hammertoe deformities of the left foot right foot does demonstrate hammertoe deformity second right with medial dislocation.  Assessment & Plan:   Assessment: Hallux valgus bilateral dislocation and hammertoe deformity second digit metatarsal phalangeal joint right foot.  Possible stress fracture navicular tuberosity left foot.  Plan: Discussed etiology pathology conservative or surgical therapies recommended supportive shoe gear for the left foot.  Since she states that he has been getting better and improving I feel that to boot this would be less than necessary.  We did discuss the possible need for surgical intervention she will notify me with  questions or concerns.  I also put her in a compression anklet for the left foot.     Ziah Leandro T. [place], [place]

## 2018-10-10 ENCOUNTER — Telehealth: Payer: Self-pay

## 2018-10-10 ENCOUNTER — Other Ambulatory Visit: Payer: Self-pay | Admitting: Certified Nurse Midwife

## 2018-10-10 DIAGNOSIS — N6310 Unspecified lump in the right breast, unspecified quadrant: Secondary | ICD-10-CM

## 2018-10-10 NOTE — Telephone Encounter (Signed)
Pt called [facility] today who states she needs an order from [facility] for a diagnostic mammogram which they are sending for her to sign. Pt was asked to contact us to notify [facility] to be on the look out for the orders. [phone] or [phone]

## 2018-10-12 NOTE — Telephone Encounter (Signed)
done

## 2018-10-18 ENCOUNTER — Ambulatory Visit
Admission: RE | Admit: 2018-10-18 | Discharge: 2018-10-18 | Disposition: A | Discharge status: Home / Self Care | Payer: BLUE CROSS/BLUE SHIELD | Source: Ambulatory Visit | Attending: Certified Nurse Midwife | Admitting: Certified Nurse Midwife

## 2018-10-18 DIAGNOSIS — N6310 Unspecified lump in the right breast, unspecified quadrant: Secondary | ICD-10-CM | POA: Diagnosis present

## 2018-11-01 ENCOUNTER — Other Ambulatory Visit: Payer: Self-pay | Admitting: Certified Nurse Midwife

## 2018-11-16 ENCOUNTER — Ambulatory Visit (INDEPENDENT_AMBULATORY_CARE_PROVIDER_SITE_OTHER): Payer: BLUE CROSS/BLUE SHIELD | Admitting: Certified Nurse Midwife

## 2018-11-16 ENCOUNTER — Other Ambulatory Visit (HOSPITAL_COMMUNITY)
Admission: RE | Admit: 2018-11-16 | Discharge: 2018-11-16 | Disposition: A | Discharge status: Home / Self Care | Payer: BLUE CROSS/BLUE SHIELD | Source: Ambulatory Visit | Attending: Certified Nurse Midwife | Admitting: Certified Nurse Midwife

## 2018-11-16 ENCOUNTER — Encounter: Payer: Self-pay | Admitting: Certified Nurse Midwife

## 2018-11-16 VITALS — BP 134/80 | HR 66 | Ht 66.0 in | Wt 173.5 lb

## 2018-11-16 DIAGNOSIS — Z124 Encounter for screening for malignant neoplasm of cervix: Secondary | ICD-10-CM | POA: Insufficient documentation

## 2018-11-16 DIAGNOSIS — Z1211 Encounter for screening for malignant neoplasm of colon: Secondary | ICD-10-CM | POA: Diagnosis not present

## 2018-11-16 DIAGNOSIS — Z01419 Encounter for gynecological examination (general) (routine) without abnormal findings: Secondary | ICD-10-CM | POA: Diagnosis present

## 2018-11-16 DIAGNOSIS — N915 Oligomenorrhea, unspecified: Secondary | ICD-10-CM

## 2018-11-16 LAB — HEMOCCULT GUIAC POC 1CARD (OFFICE): Fecal Occult Blood, POC: NEGATIVE

## 2018-11-16 NOTE — Progress Notes (Signed)
Gynecology Annual Exam  PCP: Dalia Heading, CNM  Chief Complaint:  Chief Complaint  Patient presents with  . Gynecologic Exam    needs mirena taken out, ? reinsertion; bleed last month    History of Present Illness:Carol Jordan is a 52 year old Caucasian/White female , G 1 P 1 0 0 1 , who presents for her annual exam . She is having no significant GYN problems. Her menses have been infrequent. In the last year she has had only 2 menses. They have been light and last 1-2 days. Her LMP was 10/13/2018. She has not had hot flashes. She has a Mirena IUD. FSH and LH last year were normal.   She denies dysmenorrhea.  The patient's past medical history is notable for a history of frequent UTIs that have been managed with a postcoital dose of Macrobid, a cardiac arrhythmia (no symptoms and no meds), and fibroadenomas of the left breast on biopsies done 2017.   Since her last annual GYN exam dated 09/29/2017, she has not had any significant changes in her health. Has gained 12# in the past year. She is sexually active. She is currently using an IUD for contraception. Mirena IUD was placed on 08/30/2012.  Her most recent pap smear was obtained 09/29/2017 and was  negative. Her most recent bilateral mammogram obtained on 10/18/2018 was Birads 2. She has had left breast masses biopsied in 2017 and they were found to be fibroadenomas. She has a  1 cm mass in the right breast that is stable and is  benign. There is no family history of breast cancer.  There is no family history of ovarian cancer.  The patient does do monthly self breast exams.  The patient does not smoke.  The patient does occ drink alcohol.  The patient does not use illegal drugs.  The patient exercises 4-5 times a week.  The patient does get adequate calcium in her diet and with her multivitamin  She had a recent cholesterol screen in 2019 at work that she reports as being normal.    The patient denies current symptoms  of depression.    Review of Systems: Review of Systems  Constitutional: Negative for chills, fever and weight loss.  HENT: Negative for congestion, sinus pain and sore throat.   Eyes: Negative for blurred vision and pain.  Respiratory: Negative for hemoptysis, shortness of breath and wheezing.   Cardiovascular: Negative for chest pain, palpitations and leg swelling.  Gastrointestinal: Negative for abdominal pain, blood in stool, diarrhea, heartburn, nausea and vomiting.  Genitourinary: Negative for dysuria, frequency, hematuria and urgency.  Musculoskeletal: Negative for back pain, joint pain and myalgias.  Skin: Negative for itching and rash.  Neurological: Negative for dizziness, tingling and headaches.  Endo/Heme/Allergies: Negative for environmental allergies and polydipsia. Does not bruise/bleed easily.       Negative for hirsutism   Psychiatric/Behavioral: Negative for depression. The patient is not nervous/anxious and does not have insomnia.     Past Medical History:  Past Medical History:  Diagnosis Date  . Allergic rhinitis   . Allergy   . Cardiac arrhythmia 2012   work up by cardiology (sinus arrhythmia and bigeminy)  . Chicken pox   . GERD (gastroesophageal reflux disease)   . UTI (urinary tract infection)     Past Surgical History:  Past Surgical History:  Procedure Laterality Date  . BREAST BIOPSY Bilateral 2017   neg  . BREAST SURGERY Left 08/02/2016   fibroadenomas  .  [place]  . INTRAUTERINE DEVICE (IUD) INSERTION  08/30/2012   mirena [facility]    Family History:  Family History  Problem Relation Age of Onset  . Heart disease Mother   . Hypertension Mother   . Stroke Maternal Grandmother   . Parkinson's disease Maternal Grandmother   . Breast cancer Neg Hx     Social History:  Social History   Socioeconomic History  . Marital status: Divorced    Spouse name: Not on file  . Number of children: 1  . Years of education: 45  . Highest  education level: Not on file  Occupational History  . Occupation: Environmental health practitioner  Social Needs  . Financial resource strain: Not on file  . Food insecurity:    Worry: Not on file    Inability: Not on file  . Transportation needs:    Medical: Not on file    Non-medical: Not on file  Tobacco Use  . Smoking status: Never Smoker  . Smokeless tobacco: Never Used  Substance and Sexual Activity  . Alcohol use: Yes    Alcohol/week: 0.0 - 1.0 standard drinks    Comment: OCC  . Drug use: No  . Sexual activity: Yes    Partners: Male    Birth control/protection: IUD  Lifestyle  . Physical activity:    Days per week: Not on file    Minutes per session: Not on file  . Stress: Not on file  Relationships  . Social connections:    Talks on phone: Not on file    Gets together: Not on file    Attends religious service: Not on file    Active member of club or organization: Not on file    Attends meetings of clubs or organizations: Not on file    Relationship status: Not on file  . Intimate partner violence:    Fear of current or ex partner: Not on file    Emotionally abused: Not on file    Physically abused: Not on file    Forced sexual activity: Not on file  Other Topics Concern  . Not on file  Social History Narrative  . Not on file    Allergies:  No Known Allergies  Medications:  Current Outpatient Medications:  .  fexofenadine (ALLEGRA) 180 MG tablet, TAKE 1 TABLET (180 MG) BY ORAL ROUTE ONCE DAILY, Disp: , Rfl: 3 .  levonorgestrel (MIRENA) 20 MCG/24HR IUD, 1 each by Intrauterine route once., Disp: , Rfl:  .  Multiple Vitamins-Minerals (ALIVE ONCE DAILY WOMENS 50+) TABS, Take 1 tablet by mouth daily., Disp: , Rfl:  .  nitrofurantoin, macrocrystal-monohydrate, (MACROBID) 100 MG capsule, TAKE ONE CAPSULE AS NEEDED AFTER INTERCOURSE., Disp: 30 capsule, Rfl: 5  Alive Women >50 tablets daily  Physical Exam Vitals: BP 134/80   Pulse 66   Ht 5\' 6"  (1.676 m)   Wt 173 lb 8 oz  (78.7 kg)   LMP 10/13/2018   BMI 28.00 kg/m  General: WF in NAD HEENT: normocephalic, anicteric Neck: no thyroid enlargement, no palpable nodules, no cervical lymphadenopathy  Pulmonary: No increased work of breathing, CTAB Cardiovascular: normal rate, but irregular rhythm, without murmur  Breast: Breast symmetrical, no tenderness, no palpable nodules or masses, no skin or nipple retraction present, no nipple discharge.  No axillary, infraclavicular or supraclavicular lymphadenopathy. Abdomen: Soft, non-tender, non-distended.  Umbilicus without lesions.  No hepatomegaly or masses palpable. No evidence of hernia. Genitourinary:  External: Normal external female genitalia.  Normal urethral  meatus, normal Bartholin's and Skene's glands.    Vagina: Normal vaginal mucosa, no evidence of prolapse.    Cervix: Grossly normal in appearance, no bleeding, non-tender, IUD strings seen just inside cervical os  Uterus: Retroverted, normal size, shape, and consistency, mobile, and non-tender  Adnexa: No adnexal masses, non-tender  Rectal: no masses, hemoccult (FIT) negative.  Lymphatic: no evidence of inguinal lymphadenopathy Extremities: no edema, erythema, or tenderness Neurologic: Grossly intact Psychiatric: mood appropriate, affect full     Assessment: 52 y.o. annual gyn exam. History of recurrent UTIs postcoitally Perimenopausal- Mirena IUD now in place 6 years. Cardiac arrhythmia-asymptomatic  Plan:   1) Breast cancer screening - recommend monthly self breast exam and annual screening mammograms. Mammogram is UTD.  2) Colon cancer screening: discussed options and patient wishes to do in office FIT test yearly. FIT test was negative   3) Cervical cancer screening - Pap was done.   4) Contraception - her current IUD has been in place 6 years. Will get [place] and LH drawn, and if elevated levels, will remove current IUD in another year.   5) Routine healthcare maintenance including  cholesterol and diabetes screening UTD.   6) Refills of Macrobid sent to pharmacy earlier this month.  7) RTO in 1 year  Dalia Heading, [place]

## 2018-11-17 LAB — FSH/LH
FSH: 66 m[IU]/mL
LH: 42.4 m[IU]/mL

## 2018-11-20 LAB — CYTOLOGY - PAP: Diagnosis: NEGATIVE

## 2019-07-11 ENCOUNTER — Other Ambulatory Visit: Payer: Self-pay | Admitting: Podiatry

## 2019-07-11 ENCOUNTER — Other Ambulatory Visit: Payer: Self-pay

## 2019-07-11 ENCOUNTER — Encounter: Payer: Self-pay | Admitting: Podiatry

## 2019-07-11 ENCOUNTER — Ambulatory Visit (INDEPENDENT_AMBULATORY_CARE_PROVIDER_SITE_OTHER): Payer: BC Managed Care – PPO

## 2019-07-11 ENCOUNTER — Ambulatory Visit: Payer: BLUE CROSS/BLUE SHIELD | Admitting: Podiatry

## 2019-07-11 VITALS — Temp 98.1°F

## 2019-07-11 DIAGNOSIS — S92002A Unspecified fracture of left calcaneus, initial encounter for closed fracture: Secondary | ICD-10-CM

## 2019-07-11 DIAGNOSIS — S9031XA Contusion of right foot, initial encounter: Secondary | ICD-10-CM

## 2019-07-11 DIAGNOSIS — S92355A Nondisplaced fracture of fifth metatarsal bone, left foot, initial encounter for closed fracture: Secondary | ICD-10-CM

## 2019-07-11 NOTE — Progress Notes (Signed)
She presents today after having fallen July 4 as she was stepping down a step into gravel she rolled her left foot.  She states that is been black and blue and swollen very painful is unable to bear weight on it.  She is using crutches and she is keeping it compressed with an Ace bandage.  Objective: Vital signs are stable she is alert and oriented x3.  Left foot is grossly swollen considerable ecchymosis has settled along the line of [place].  Severe pain on palpation of the fifth met base the cuboid sinus tarsi area and the anterior lateral ankle.  Radiographs taken today demonstrate what appears to be a fracture of the anterior process of the calcaneus possible fracture of the cuboid bone definite fracture of the fifth metatarsal base not a Jones fracture cannot rule out separation of the ankle.  Assessment: Multiple fractures left foot considerable soft tissue damage.  Plan: Requesting MRI of the left foot and ankle.  I at the very least need to see the fifth metatarsal base and lateral ankle.  I placed her in a cam walker instructed her to continue nonweightbearing status.

## 2019-07-12 ENCOUNTER — Ambulatory Visit
Admission: RE | Admit: 2019-07-12 | Discharge: 2019-07-12 | Disposition: A | Discharge status: Home / Self Care | Payer: BC Managed Care – PPO | Source: Ambulatory Visit | Attending: Podiatry | Admitting: Podiatry

## 2019-07-12 ENCOUNTER — Encounter: Payer: Self-pay | Admitting: Radiology

## 2019-07-12 DIAGNOSIS — S92355A Nondisplaced fracture of fifth metatarsal bone, left foot, initial encounter for closed fracture: Secondary | ICD-10-CM | POA: Insufficient documentation

## 2019-07-12 DIAGNOSIS — S92002A Unspecified fracture of left calcaneus, initial encounter for closed fracture: Secondary | ICD-10-CM | POA: Insufficient documentation

## 2019-07-12 NOTE — Addendum Note (Signed)
Addended by: Graceann Congress D on: 07/12/2019 10:43 AM   Modules accepted: Orders

## 2019-07-16 ENCOUNTER — Telehealth: Payer: Self-pay

## 2019-07-16 NOTE — Telephone Encounter (Signed)
-----   Message from Andres Ege, RN sent at 07/13/2019  9:44 AM EDT ----- Janace Hoard, Please assist pt. Marcy Siren ----- Message ----- From: Garrel Ridgel, [place] Sent: 07/12/2019   5:04 PM EDT To: Andres Ege, RN  Multiple fractures not soft tissue injury.  Stay in [place] with crutches and/or knee scooter.

## 2019-07-16 NOTE — Telephone Encounter (Signed)
I spoke with patient and informed her of her results and Dr. Stephenie Acres instructions.  She verbalized understanding and was sent to scheduling for follow up in about 3 weeks.

## 2019-08-15 ENCOUNTER — Ambulatory Visit: Payer: BC Managed Care – PPO | Admitting: Podiatry

## 2019-08-15 ENCOUNTER — Other Ambulatory Visit: Payer: Self-pay

## 2019-08-15 ENCOUNTER — Encounter: Payer: Self-pay | Admitting: Podiatry

## 2019-08-15 ENCOUNTER — Ambulatory Visit (INDEPENDENT_AMBULATORY_CARE_PROVIDER_SITE_OTHER): Payer: BC Managed Care – PPO

## 2019-08-15 VITALS — Temp 97.8°F

## 2019-08-15 DIAGNOSIS — S92902A Unspecified fracture of left foot, initial encounter for closed fracture: Secondary | ICD-10-CM

## 2019-08-15 NOTE — Progress Notes (Signed)
She presents today for follow-up of her fractured foot left.  States that is still sore but is doing better than it was.  Continues to wear the cam walker nonweightbearing status.  Objective: Moderate edema to the left foot ecchymosis is resolving.  Still has tenderness on range of motion of the subtalar joint.  Tenderness on direct palpation of fifth metatarsal left.  MRI findings are consistent with fracture to the anterior process of the calcaneus the cuboid and the fifth metatarsal base fracture.  MRI states no dislocations.  Radiographs taken today demonstrate a mild distraction of the fifth met base otherwise everything else appears to be in good position.  Assessment: Well-healing fracture about 4 weeks out left rear foot.  Plan: Absolutely no weightbearing back in her cam boot and I will follow-up with her in 4 weeks for another set of x-rays.

## 2019-09-19 ENCOUNTER — Ambulatory Visit (INDEPENDENT_AMBULATORY_CARE_PROVIDER_SITE_OTHER): Payer: BC Managed Care – PPO | Admitting: Podiatry

## 2019-09-19 ENCOUNTER — Ambulatory Visit (INDEPENDENT_AMBULATORY_CARE_PROVIDER_SITE_OTHER): Payer: BC Managed Care – PPO

## 2019-09-19 ENCOUNTER — Encounter: Payer: Self-pay | Admitting: Podiatry

## 2019-09-19 ENCOUNTER — Other Ambulatory Visit: Payer: Self-pay

## 2019-09-19 DIAGNOSIS — S92025D Nondisplaced fracture of anterior process of left calcaneus, subsequent encounter for fracture with routine healing: Secondary | ICD-10-CM

## 2019-09-19 DIAGNOSIS — S92355D Nondisplaced fracture of fifth metatarsal bone, left foot, subsequent encounter for fracture with routine healing: Secondary | ICD-10-CM | POA: Diagnosis not present

## 2019-09-19 DIAGNOSIS — S92215D Nondisplaced fracture of cuboid bone of left foot, subsequent encounter for fracture with routine healing: Secondary | ICD-10-CM

## 2019-09-20 ENCOUNTER — Encounter: Payer: Self-pay | Admitting: Podiatry

## 2019-09-20 NOTE — Progress Notes (Signed)
She presents today for follow-up of multiple fractures including the fifth metatarsal base the cuboid bone and the anterior process of the calcaneus.  She states that she is doing better she has been nonweightbearing with her knee scooter.  Objective: Vital signs are stable she is alert and oriented x3.  She no longer has much pain on palpation minimal edema no cellulitis drainage or odor radiographs taken today do demonstrate a healing fifth metatarsal base fracture.  Assessment: Slowly healing fractures.  Can no longer see the fractures to the anterior process of the calcaneus or th cuboid.  Plan: Discussed etiology pathology and surgical therapies at this point time will go ahead and continue current therapies.  She will continue nonweightbearing use of the cam walker follow-up with her in about 3 weeks or so for another set of x-rays.

## 2019-10-10 ENCOUNTER — Ambulatory Visit (INDEPENDENT_AMBULATORY_CARE_PROVIDER_SITE_OTHER): Payer: BC Managed Care – PPO

## 2019-10-10 ENCOUNTER — Ambulatory Visit: Payer: BC Managed Care – PPO | Admitting: Podiatry

## 2019-10-10 ENCOUNTER — Encounter: Payer: Self-pay | Admitting: Podiatry

## 2019-10-10 ENCOUNTER — Other Ambulatory Visit: Payer: Self-pay

## 2019-10-10 DIAGNOSIS — S92215D Nondisplaced fracture of cuboid bone of left foot, subsequent encounter for fracture with routine healing: Secondary | ICD-10-CM

## 2019-10-10 DIAGNOSIS — S92025D Nondisplaced fracture of anterior process of left calcaneus, subsequent encounter for fracture with routine healing: Secondary | ICD-10-CM

## 2019-10-10 DIAGNOSIS — S92355D Nondisplaced fracture of fifth metatarsal bone, left foot, subsequent encounter for fracture with routine healing: Secondary | ICD-10-CM

## 2019-10-10 NOTE — Progress Notes (Signed)
I know you do she presents today for follow-up of her fractured fifth met base cuboid and calcaneal anterior process states that we are getting there is feeling better but still swells.  She continues to wear her boot and utilize 1 crutch.  Objective: Vital signs are stable alert and oriented x3.  Pulses are palpable mild edema to the forefoot bilateral much decrease in tenderness on palpation of the fifth met the cuboid and the sinus tarsi.  Radiographs taken today do demonstrate some demineralization of the bone however the fracture sites have gone on to heal uneventfully.  Assessment: Well-healing fractures fifth met base cuboid and anterior process of the calcaneus left foot.  Plan: Encouraged her to try to switch back to a regular shoe over the next month and begin be in that shoe gear to help reinforce and strengthen her bones and remain all over a lot re-mineralized them.  At this point I feel that it is necessary to continue to wear the compression anklet so provided her with another 1 of those today and I will follow-up with her in 1 month.

## 2019-11-07 ENCOUNTER — Ambulatory Visit: Payer: BC Managed Care – PPO | Admitting: Podiatry

## 2020-01-04 ENCOUNTER — Other Ambulatory Visit: Payer: Self-pay

## 2020-01-04 ENCOUNTER — Ambulatory Visit (INDEPENDENT_AMBULATORY_CARE_PROVIDER_SITE_OTHER): Payer: BC Managed Care – PPO | Admitting: Certified Nurse Midwife

## 2020-01-04 VITALS — BP 120/78 | Ht 66.0 in | Wt 176.0 lb

## 2020-01-04 DIAGNOSIS — Z30432 Encounter for removal of intrauterine contraceptive device: Secondary | ICD-10-CM

## 2020-01-04 DIAGNOSIS — Z124 Encounter for screening for malignant neoplasm of cervix: Secondary | ICD-10-CM | POA: Diagnosis not present

## 2020-01-04 DIAGNOSIS — Z01419 Encounter for gynecological examination (general) (routine) without abnormal findings: Secondary | ICD-10-CM

## 2020-01-04 DIAGNOSIS — Z1211 Encounter for screening for malignant neoplasm of colon: Secondary | ICD-10-CM

## 2020-01-04 DIAGNOSIS — Z8781 Personal history of (healed) traumatic fracture: Secondary | ICD-10-CM

## 2020-01-04 DIAGNOSIS — Z780 Asymptomatic menopausal state: Secondary | ICD-10-CM

## 2020-01-04 DIAGNOSIS — N951 Menopausal and female climacteric states: Secondary | ICD-10-CM

## 2020-01-04 DIAGNOSIS — Z1231 Encounter for screening mammogram for malignant neoplasm of breast: Secondary | ICD-10-CM

## 2020-01-04 MED ORDER — NITROFURANTOIN MONOHYD MACRO 100 MG PO CAPS: ORAL_CAPSULE | ORAL | 2 refills | Status: DC

## 2020-01-04 NOTE — Progress Notes (Addendum)
Gynecology Annual Exam  PCP: Dalia Heading, CNM  Chief Complaint:  Chief Complaint  Patient presents with  . Gynecologic Exam    Bump under left arm     History of Present Illness:Carol Jordan is a 54 year old Caucasian/White female , G 1 P 1 0 0 1 , who presents for her annual exam . She is having no significant GYN problems. Noticed a bump in her left axilla that has started to resolve after it popped. SHe has not had any menses since her last visit. Marland Kitchen Her LMP was 10/13/2018. She has had more hot flashes. She has a Mirena IUD. [place] and LH last year were elevated in the menopausal range. Plan is to remove IUD today.  She denies dysmenorrhea.  The patient's past medical history is notable for a history of frequent UTIs that have been managed with a postcoital dose of Macrobid, a cardiac arrhythmia (no symptoms and no meds), and fibroadenomas of the left breast on biopsies done 2017.   Since her last annual GYN exam dated 11/16/2018, she has fractured three bones in her left foot when she tripped and is still recovering She is sexually active. She is currently using an IUD for contraception. Mirena IUD was placed on 08/30/2012.  Her most recent pap smear was obtained 11/16/2018 and was  negative. Her most recent bilateral mammogram obtained on 10/18/2018 was [facility] 2. She has had left breast masses biopsied in 2017 and they were found to be fibroadenomas. She has a  1 cm mass in the right breast that is stable and is  benign. There is no family history of breast cancer.  There is no family history of ovarian cancer.  The patient does do monthly self breast exams.  The patient does not smoke.  The patient does occ drink alcohol.  The patient does not use illegal drugs.  The patient had stopped exercising due to her injury but has started back biking and exercising her upper body. The patient does get adequate calcium in her diet and with her multivitamin  She had a recent  cholesterol screen in 2019 at work that she reports as being normal.    The patient denies current symptoms of depression.    Review of Systems: Review of Systems  Constitutional: Negative for chills, fever and weight loss.  HENT: Negative for congestion, sinus pain and sore throat.   Eyes: Negative for blurred vision and pain.  Respiratory: Negative for hemoptysis, shortness of breath and wheezing.   Cardiovascular: Negative for chest pain, palpitations and leg swelling.  Gastrointestinal: Negative for abdominal pain, blood in stool, diarrhea, heartburn, nausea and vomiting.  Genitourinary: Negative for dysuria, frequency, hematuria and urgency.       Positive for amenorrhea  Musculoskeletal: Negative for back pain, joint pain and myalgias.       Positive for left foot pain  Skin: Negative for itching and rash.  Neurological: Negative for dizziness, tingling and headaches.  Endo/Heme/Allergies: Negative for environmental allergies and polydipsia. Does not bruise/bleed easily.       Positive for hot flashes   Psychiatric/Behavioral: Negative for depression. The patient is not nervous/anxious and does not have insomnia.     Past Medical History:  Past Medical History:  Diagnosis Date  . Allergic rhinitis   . Allergy   . Cardiac arrhythmia 2012   work up by cardiology (sinus arrhythmia and bigeminy)  . Chicken pox   . GERD (gastroesophageal reflux disease)   .  UTI (urinary tract infection)     Past Surgical History:  Past Surgical History:  Procedure Laterality Date  . BREAST BIOPSY Bilateral 2017   neg  . BREAST SURGERY Left 08/02/2016   fibroadenomas  . [facility]  . INTRAUTERINE DEVICE (IUD) INSERTION  08/30/2012   mirena [facility]    Family History:  Family History  Problem Relation Age of Onset  . Heart disease Mother   . Hypertension Mother   . Stroke Maternal Grandmother   . Parkinson's disease Maternal Grandmother   . Breast cancer Neg Hx     Social  History:  Social History   Socioeconomic History  . Marital status: Divorced    Spouse name: Not on file  . Number of children: 1  . Years of education: 82  . Highest education level: Not on file  Occupational History  . Occupation: Environmental health practitioner  Tobacco Use  . Smoking status: Never Smoker  . Smokeless tobacco: Never Used  Substance and Sexual Activity  . Alcohol use: Yes    Alcohol/week: 0.0 - 1.0 standard drinks    Comment: OCC  . Drug use: No  . Sexual activity: Yes    Partners: Male    Birth control/protection: I.U.D.  Other Topics Concern  . Not on file  Social History Narrative  . Not on file   Social Determinants of Health   Financial Resource Strain:   . Difficulty of Paying Living Expenses: Not on file  Food Insecurity:   . Worried About Charity fundraiser in the Last Year: Not on file  . Ran Out of Food in the Last Year: Not on file  Transportation Needs:   . Lack of Transportation (Medical): Not on file  . Lack of Transportation (Non-Medical): Not on file  Physical Activity:   . Days of Exercise per Week: Not on file  . Minutes of Exercise per Session: Not on file  Stress:   . Feeling of Stress : Not on file  Social Connections:   . Frequency of Communication with Friends and Family: Not on file  . Frequency of Social Gatherings with Friends and Family: Not on file  . Attends Religious Services: Not on file  . Active Member of Clubs or Organizations: Not on file  . Attends Archivist Meetings: Not on file  . Marital Status: Not on file  Intimate Partner Violence:   . Fear of Current or Ex-Partner: Not on file  . Emotionally Abused: Not on file  . Physically Abused: Not on file  . Sexually Abused: Not on file    Allergies:  No Known Allergies  Medications:  Current Outpatient Medications:  .  fexofenadine (ALLEGRA) 180 MG tablet, TAKE 1 TABLET (180 MG) BY ORAL ROUTE ONCE DAILY, Disp: , Rfl: 3 .  fluticasone (FLONASE) 50 MCG/ACT  nasal spray, SPRAY 2 SPRAYS IN EACH NOSTRIL ONCE DAILY, Disp: , Rfl:  .  ibuprofen (ADVIL) 200 MG tablet, Take 200 mg by mouth every 6 (six) hours as needed., Disp: , Rfl:  .  ibuprofen (ADVIL) 800 MG tablet, , Disp: , Rfl:  .  levonorgestrel (MIRENA) 20 MCG/24HR IUD, 1 each by Intrauterine route once., Disp: , Rfl:  .  Multiple Vitamins-Minerals (ALIVE ONCE DAILY WOMENS 50+) TABS, Take 1 tablet by mouth daily., Disp: , Rfl:  .  nitrofurantoin, macrocrystal-monohydrate, (MACROBID) 100 MG capsule, Take one after intercourse, Disp: 30 capsule, Rfl: 2  Calcium + vitamin D+ vitamin K  Physical Exam  Vitals: BP 120/78   Ht 5\' 6"  (1.676 m)   Wt 176 lb (79.8 kg)   BMI 28.41 kg/m  General: WF in NAD HEENT: normocephalic, anicteric Neck: no thyroid enlargement, no palpable nodules, no cervical lymphadenopathy  Pulmonary: No increased work of breathing, CTAB Cardiovascular: normal rate, but irregular rhythm, without murmur  Breast: Breast symmetrical, no tenderness, no palpable nodules or masses, no skin or nipple retraction present, no nipple discharge.  No axillary, infraclavicular or supraclavicular lymphadenopathy. Small area of folliculitis-healing in left axilla Abdomen: Soft, non-tender, non-distended.  Umbilicus without lesions.  No hepatomegaly or masses palpable. No evidence of hernia. Genitourinary:  External: Normal external female genitalia.  Normal urethral meatus, normal Bartholin's and Skene's glands.    Vagina: Normal vaginal mucosa, no evidence of prolapse.    Cervix: Grossly normal in appearance, no bleeding, non-tender, IUD strings seen just inside cervical os  Uterus: Retroverted, normal size, shape, and consistency, mobile, and non-tender  Adnexa: No adnexal masses, non-tender  Rectal: no masses, hemoccult (FIT) negative.  Lymphatic: no evidence of inguinal lymphadenopathy Extremities: no edema, erythema, or tenderness Neurologic: Grossly intact Psychiatric: mood  appropriate, affect full     Assessment: 54 y.o. annual gyn exam. History of recurrent UTIs postcoitally Menopausal-no longer needs IUD  Vasomotor symptoms Cardiac arrhythmia-asymptomatic  Plan:   1) Breast cancer screening - recommend monthly self breast exam and annual screening mammograms. Mammogram is ordered, and patient to schedule  2) Colon cancer screening: discussed options and patient wishes to do in office FIT test yearly. FIT test was negative   3) Cervical cancer screening - Pap was done.   4) Contraception - no longer needed. Mirena IUD removed intact. Discussed treatments for hot flashes. Wishes to try OTC herbal treatment (Remifemin or Estroven) ot Vitamin E  5) Routine healthcare maintenance including cholesterol and diabetes screening UTD.  Discussed calcium and vitamin D3 requirements. DEXA scan ordered due to hx of slow healing foot fracture.  6) Refills of Macrobid sent to pharmacy.   7) RTO in 1 year  Dalia Heading, CNM    History of Present Illness:  Carol Jordan is a 54 y.o. that had a Mirena IUD placed approximately 7 years ago.She is now postmenopausal and no longer needs contraception. She presents for an IUD removal Allergies: has No Known Allergies.  Physical Exam:  BP 120/78   Ht 5\' 6"  (1.676 m)   Wt 176 lb (79.8 kg)   BMI 28.41 kg/m  Body mass index is 28.41 kg/m. Constitutional: Well nourished, well developed female in no acute distress.  Abdomen: diffusely non tender to palpation, non distended, and no masses, hernias Neuro: Grossly intact Psych:  Normal mood and affect.    Pelvic exam:  Two IUD strings palpated just inside  the cervical os. EGBUS, vaginal vault and cervix: within normal limits  IUD Removal I was unable to initiallygrasp the IUD strings with the packing forceps. An IUD remover was used to bring the strings into view. They were then able to be grasped with the packing forceps and the  IUD removed without  problem.  Pt tolerated this well.  IUD noted to be intact.  Assessment: IUD Removal  Plan: RTO in 1 year for annual exam  Dalia Heading, CNM

## 2020-01-05 DIAGNOSIS — N951 Menopausal and female climacteric states: Secondary | ICD-10-CM | POA: Insufficient documentation

## 2020-01-08 ENCOUNTER — Telehealth: Payer: Self-pay | Admitting: Certified Nurse Midwife

## 2020-01-08 LAB — IGP, APTIMA HPV: HPV Aptima: NEGATIVE

## 2020-01-08 NOTE — Telephone Encounter (Signed)
Called patient and she is aware of her appt at [facility] for her mammogram and her DEXA scan on Thursday 01/31/20 @ 10:20 am. Patient aware no calcium morning of scan.

## 2020-01-13 ENCOUNTER — Encounter: Payer: Self-pay | Admitting: Certified Nurse Midwife

## 2020-01-31 ENCOUNTER — Ambulatory Visit
Admission: RE | Admit: 2020-01-31 | Discharge: 2020-01-31 | Disposition: A | Discharge status: Home / Self Care | Payer: BC Managed Care – PPO | Source: Ambulatory Visit | Attending: Certified Nurse Midwife | Admitting: Certified Nurse Midwife

## 2020-01-31 DIAGNOSIS — Z1231 Encounter for screening mammogram for malignant neoplasm of breast: Secondary | ICD-10-CM | POA: Diagnosis not present

## 2020-01-31 DIAGNOSIS — Z8781 Personal history of (healed) traumatic fracture: Secondary | ICD-10-CM

## 2020-01-31 DIAGNOSIS — Z780 Asymptomatic menopausal state: Secondary | ICD-10-CM

## 2020-01-31 DIAGNOSIS — J019 Acute sinusitis, unspecified: Secondary | ICD-10-CM | POA: Diagnosis not present

## 2020-01-31 DIAGNOSIS — M8589 Other specified disorders of bone density and structure, multiple sites: Secondary | ICD-10-CM | POA: Diagnosis not present

## 2020-02-27 ENCOUNTER — Encounter: Payer: Self-pay | Admitting: Certified Nurse Midwife

## 2020-02-27 DIAGNOSIS — Z1211 Encounter for screening for malignant neoplasm of colon: Secondary | ICD-10-CM | POA: Diagnosis not present

## 2020-03-03 LAB — COLOGUARD: Cologuard: NEGATIVE

## 2020-04-02 ENCOUNTER — Encounter: Payer: Self-pay | Admitting: Certified Nurse Midwife

## 2020-04-16 DIAGNOSIS — M19072 Primary osteoarthritis, left ankle and foot: Secondary | ICD-10-CM | POA: Diagnosis not present

## 2020-04-16 DIAGNOSIS — M25572 Pain in left ankle and joints of left foot: Secondary | ICD-10-CM | POA: Diagnosis not present

## 2020-04-16 DIAGNOSIS — M2011 Hallux valgus (acquired), right foot: Secondary | ICD-10-CM | POA: Diagnosis not present

## 2020-05-05 DIAGNOSIS — Z0189 Encounter for other specified special examinations: Secondary | ICD-10-CM | POA: Diagnosis not present

## 2020-05-06 DIAGNOSIS — Z713 Dietary counseling and surveillance: Secondary | ICD-10-CM | POA: Diagnosis not present

## 2020-05-06 DIAGNOSIS — Z043 Encounter for examination and observation following other accident: Secondary | ICD-10-CM | POA: Diagnosis not present

## 2020-05-06 DIAGNOSIS — E785 Hyperlipidemia, unspecified: Secondary | ICD-10-CM | POA: Diagnosis not present

## 2020-05-14 ENCOUNTER — Ambulatory Visit (INDEPENDENT_AMBULATORY_CARE_PROVIDER_SITE_OTHER): Payer: BC Managed Care – PPO | Admitting: Certified Nurse Midwife

## 2020-05-14 ENCOUNTER — Other Ambulatory Visit: Payer: Self-pay

## 2020-05-14 ENCOUNTER — Encounter: Payer: Self-pay | Admitting: Certified Nurse Midwife

## 2020-05-14 VITALS — BP 138/84 | Ht 66.0 in | Wt 174.0 lb

## 2020-05-14 DIAGNOSIS — M8580 Other specified disorders of bone density and structure, unspecified site: Secondary | ICD-10-CM | POA: Diagnosis not present

## 2020-05-14 DIAGNOSIS — R7401 Elevation of levels of liver transaminase levels: Secondary | ICD-10-CM

## 2020-05-14 DIAGNOSIS — Z780 Asymptomatic menopausal state: Secondary | ICD-10-CM

## 2020-05-14 DIAGNOSIS — E7800 Pure hypercholesterolemia, unspecified: Secondary | ICD-10-CM

## 2020-05-14 NOTE — Progress Notes (Signed)
  HPI: 54 year old G1 P92 postmenopausal female who presents to discuss recent labs she had done at work. Her thyroid screen revealed a TSH=2.290, thyroxine. She had thyroid antibodies 4.3 (low), T3 uptake 26, FTI 1.1 (low). She then had thyroid antibodies drawn which were normal as was her free T4 (1.02). She is not on any HT, but is taking Estroven.  Her chemistry panel was significant for a slightly elevated ALT of 37, a total cholesterol 221, triglycerides 54, HDL 67, LDL 145. CBC, hemoglobin A1C, and vitamin D3 were WNL.  Also reviewed DEXA results: T scores of -1.1 and -1.2. Advised to continue calcium and vitamin D3 supplements and strength training  PMHx: She  has a past medical history of Allergic rhinitis, Allergy, Cardiac arrhythmia (2012), Chicken pox, GERD (gastroesophageal reflux disease), and UTI (urinary tract infection). Also,  has a past surgical history that includes Hernia repair [id number]); Breast surgery (Left, 08/02/2016); Intrauterine device (iud) insertion (08/30/2012); and Breast biopsy (Left, 2017)., family history includes Heart disease in her mother; Hypertension in her mother; Parkinson's disease in her maternal grandmother; Stroke in her maternal grandmother.,  reports that she has never smoked. She has never used smokeless tobacco. She reports current alcohol use. She reports that she does not use drugs.   She has a current medication list which includes the following prescription(s): calcium 500 + d3, fexofenadine, fluticasone, ibuprofen, alive once daily womens 50+, and nitrofurantoin (macrocrystal-monohydrate). Also, has No Known Allergies.  ROS  Objective: BP 138/84   Ht 5\' 6"  (1.676 m)   Wt 174 lb (78.9 kg)   BMI 28.08 kg/m   Physical examination Constitutional NAD, Conversant  Skin No rashes, lesions or ulceration.   Extremities: Moves all appropriately.  Normal ROM for age. No lymphadenopathy.  Neuro: Grossly intact  Psych: Oriented to PPT.  Normal  mood. Normal affect.   Assessment:  Free thyroxine and TSH both normal. I am not sure why the thyroxine and FTI were minimally decreased and whether that is significant Slight elevation of ALT Borderline hyperlipidemia   P: Offered to refer to endocrinologist if desires to discuss thyroid lab results. She is in need of a PCP-recommend seeing Dr Carmela Rima Recommend repeating ALT in 3-6 mos Recommend decreasing trans fats in diet Follow up for annual exam I  Jan 2022  Dalia Heading, CNM

## 2020-06-03 DIAGNOSIS — R21 Rash and other nonspecific skin eruption: Secondary | ICD-10-CM | POA: Diagnosis not present

## 2020-10-15 DIAGNOSIS — M19072 Primary osteoarthritis, left ankle and foot: Secondary | ICD-10-CM | POA: Diagnosis not present

## 2020-10-15 DIAGNOSIS — M25572 Pain in left ankle and joints of left foot: Secondary | ICD-10-CM | POA: Diagnosis not present

## 2020-10-16 IMAGING — MR MRI OF THE LEFT ANKLE WITHOUT CONTRAST
5 series · 40 of 40 positions shown · non-contrast
Comparison: None.

CLINICAL DATA: Lateral ankle pain and swelling

EXAM:
MRI OF THE LEFT ANKLE WITHOUT CONTRAST
TECHNIQUE: Multiplanar, multisequence MR imaging of the ankle was performed. No
intravenous contrast was administered.

[Series 3: PD fat-sat · axial · left · 3.0mm · 0.50mm/px · z∈[-93,+46]mm · 10 of 36 slices shown]
[im 1/36]
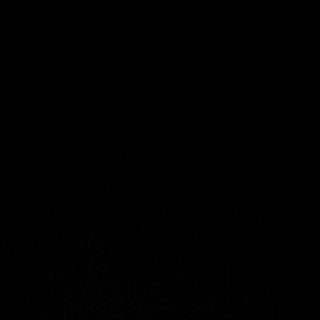
[im 4/36]
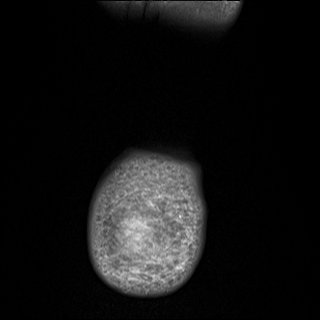
[im 8/36]
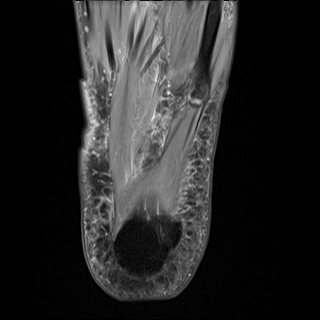
[im 12/36]
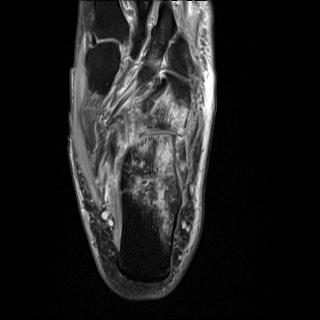
[im 16/36]
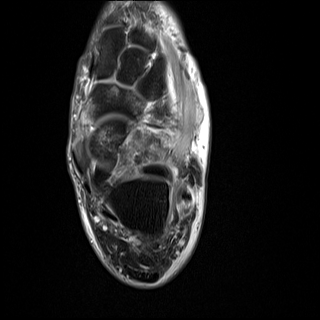
[im 20/36]
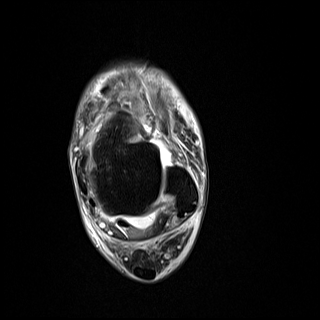
[im 24/36]
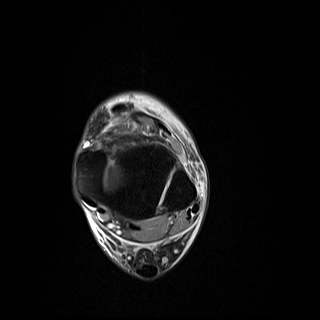
[im 28/36]
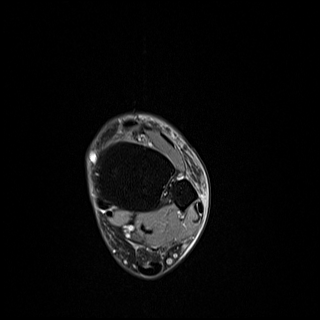
[im 32/36]
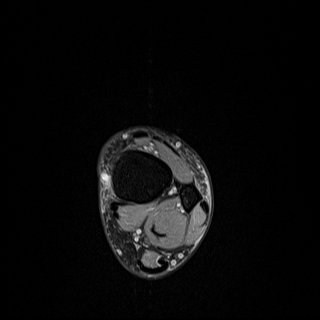
[im 36/36]
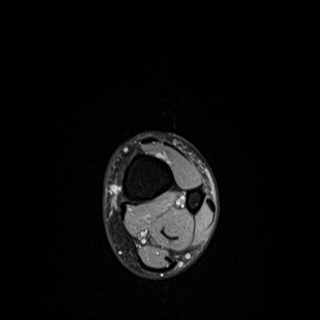

[Series 4: T2 fat-sat · axial · left · 3.0mm · 0.50mm/px · z∈[-93,+46]mm · 10 of 36 slices shown (1 of 2)]
[im 1/36]
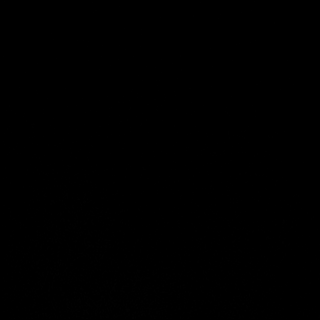
[im 4/36]
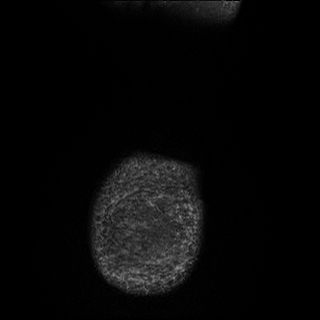
[im 8/36]
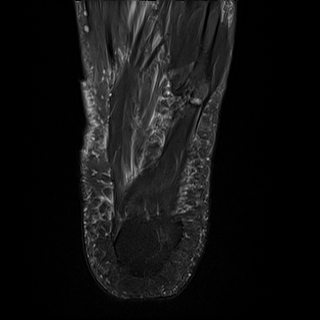
[im 12/36]
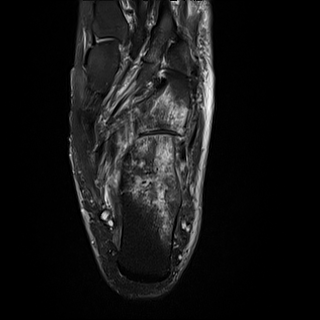
[im 16/36]
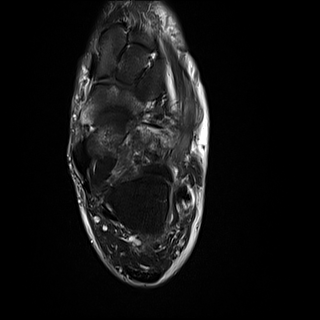
[im 20/36]
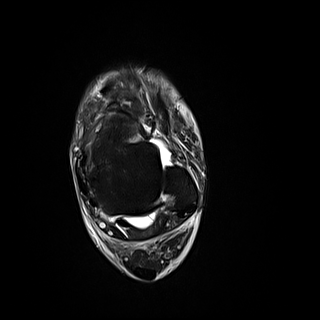
[im 24/36]
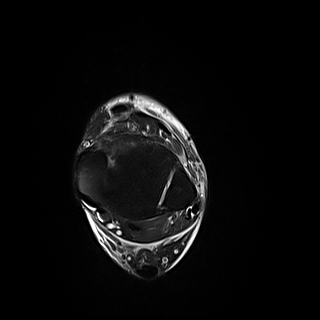
[im 28/36]
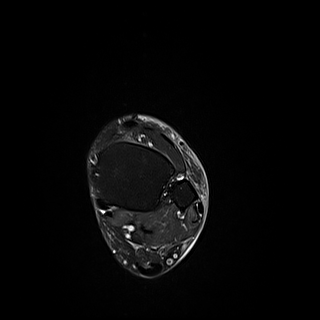
[im 32/36]
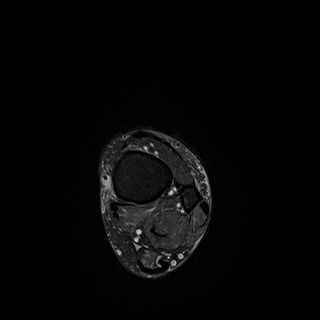
[im 36/36]
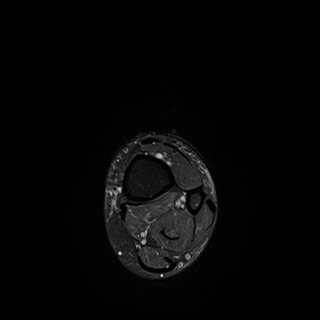

[Series 5: T2 fat-sat · coronal · left · 3.0mm · 0.62mm/px · 10 of 40 slices shown (2 of 2)]
[im 1/40]
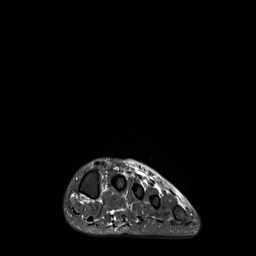
[im 5/40]
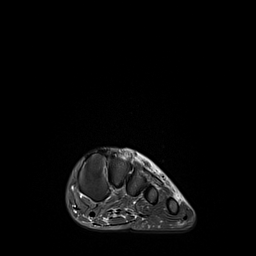
[im 9/40]
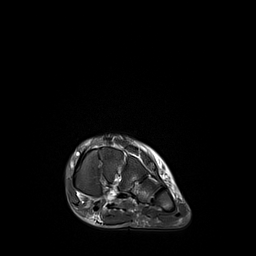
[im 14/40]
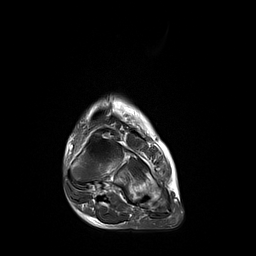
[im 18/40]
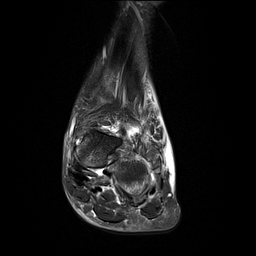
[im 22/40]
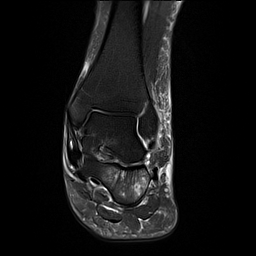
[im 27/40]
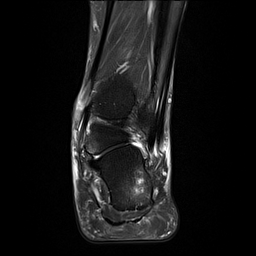
[im 31/40]
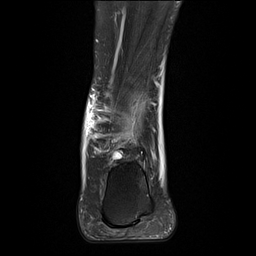
[im 35/40]
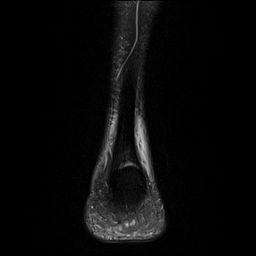
[im 40/40]
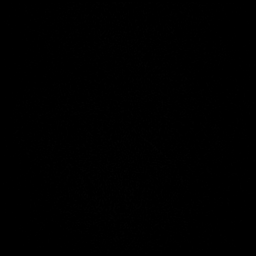

[Series 6: T1 · sagittal · left · 4.0mm · 0.70mm/px · 5 of 21 slices shown]
[im 1/21]
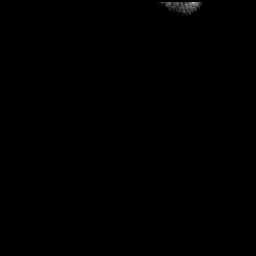
[im 6/21]
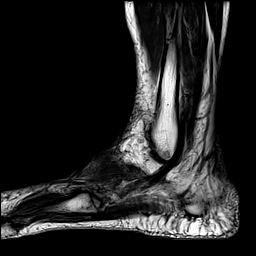
[im 11/21]
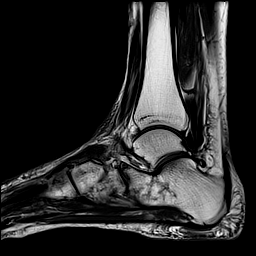
[im 16/21]
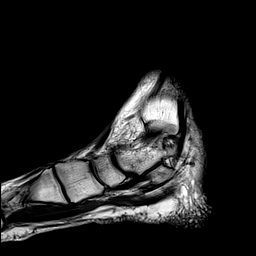
[im 21/21]
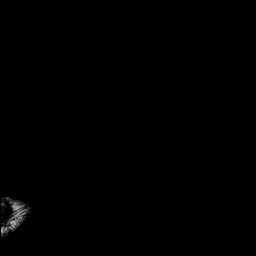

[Series 7: STIR · sagittal · left · 4.0mm · 0.35mm/px · 5 of 21 slices shown]
[im 1/21]
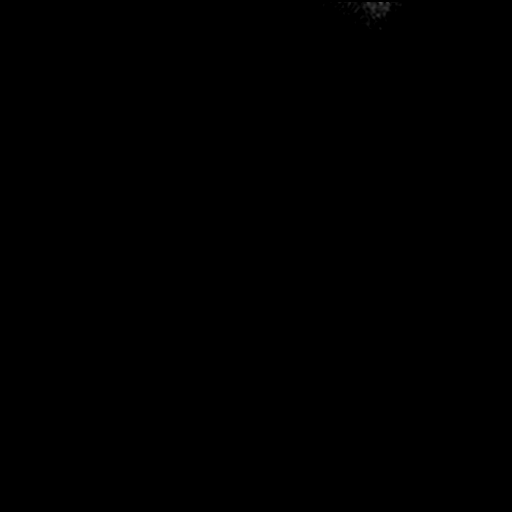
[im 6/21]
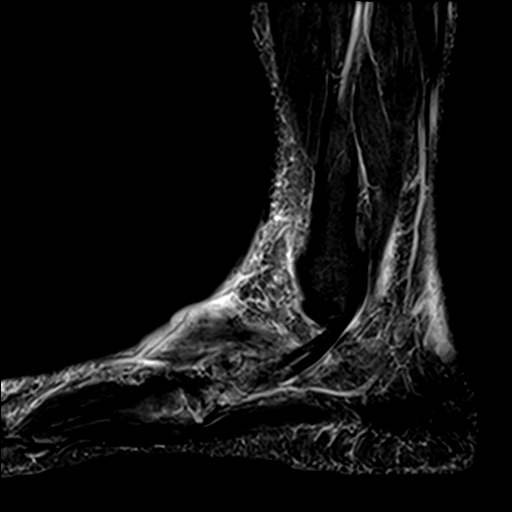
[im 11/21]
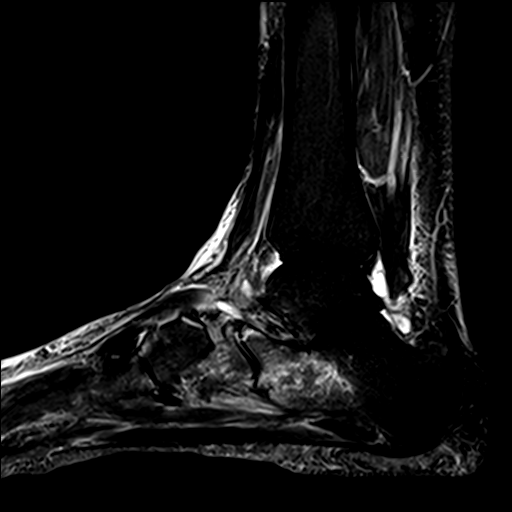
[im 16/21]
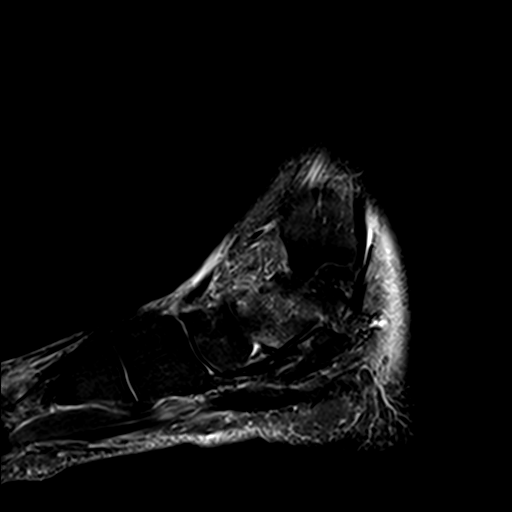
[im 21/21]
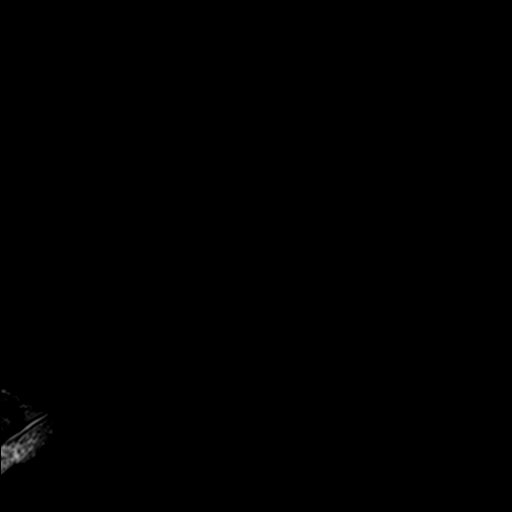

[40 of 40 positions shown; findings below may reference images not displayed]

FINDINGS: TENDONS

Peroneal: Peroneal longus tendon intact. Peroneal brevis intact.

Posteromedial: Posterior tibial tendon intact. Flexor hallucis
longus tendon intact. Flexor digitorum longus tendon intact.

Anterior: Tibialis anterior tendon intact. Extensor hallucis longus
tendon intact Extensor digitorum longus tendon intact.

Achilles:  Intact.

Plantar Fascia: Intact.

LIGAMENTS

Lateral: Anterior talofibular ligament intact. Calcaneofibular
ligament intact. Posterior talofibular ligament intact. Anterior and
posterior tibiofibular ligaments intact.

Medial: Deltoid ligament intact. Spring ligament intact.

Lisfranc: Intact.

CARTILAGE

Ankle Joint: Small joint effusion. Normal ankle mortise. No chondral
defect.

Subtalar Joints/Sinus Tarsi: Normal subtalar joints. No subtalar
joint effusion. Normal sinus tarsi.

Bones: Nondisplaced comminuted fracture of the anterior calcaneus
extending to the articular surface of the calcaneocuboid joint.
Nondisplaced fracture of the plantar proximal peripheral aspect of
the cuboid. Nondisplaced fracture of the base of the fifth
metatarsal with surrounding marrow edema.

Soft Tissue: No fluid collection or hematoma. Generalized soft
tissue edema around the foot and ankle.
IMPRESSION: 1. Nondisplaced comminuted fracture of the anterior calcaneus
extending to the articular surface of the calcaneocuboid joint.
2. Nondisplaced fracture of the plantar proximal peripheral aspect
of the cuboid.
3. Nondisplaced fracture of the base of the fifth metatarsal with
surrounding marrow edema.

## 2021-04-30 DIAGNOSIS — Z0189 Encounter for other specified special examinations: Secondary | ICD-10-CM | POA: Diagnosis not present

## 2021-05-07 DIAGNOSIS — Z0131 Encounter for examination of blood pressure with abnormal findings: Secondary | ICD-10-CM | POA: Diagnosis not present

## 2021-05-07 DIAGNOSIS — Z713 Dietary counseling and surveillance: Secondary | ICD-10-CM | POA: Diagnosis not present

## 2021-05-07 DIAGNOSIS — Z043 Encounter for examination and observation following other accident: Secondary | ICD-10-CM | POA: Diagnosis not present

## 2021-09-21 ENCOUNTER — Encounter: Payer: Self-pay | Admitting: Obstetrics and Gynecology

## 2021-09-21 ENCOUNTER — Other Ambulatory Visit: Payer: Self-pay

## 2021-09-21 ENCOUNTER — Ambulatory Visit (INDEPENDENT_AMBULATORY_CARE_PROVIDER_SITE_OTHER): Payer: BC Managed Care – PPO | Admitting: Obstetrics and Gynecology

## 2021-09-21 VITALS — BP 120/72 | Ht 66.0 in | Wt 169.2 lb

## 2021-09-21 DIAGNOSIS — Z1211 Encounter for screening for malignant neoplasm of colon: Secondary | ICD-10-CM

## 2021-09-21 DIAGNOSIS — L732 Hidradenitis suppurativa: Secondary | ICD-10-CM

## 2021-09-21 DIAGNOSIS — Z0000 Encounter for general adult medical examination without abnormal findings: Secondary | ICD-10-CM | POA: Diagnosis not present

## 2021-09-21 DIAGNOSIS — Z124 Encounter for screening for malignant neoplasm of cervix: Secondary | ICD-10-CM

## 2021-09-21 DIAGNOSIS — Z01419 Encounter for gynecological examination (general) (routine) without abnormal findings: Secondary | ICD-10-CM

## 2021-09-21 DIAGNOSIS — Z1231 Encounter for screening mammogram for malignant neoplasm of breast: Secondary | ICD-10-CM | POA: Diagnosis not present

## 2021-09-21 MED ORDER — CLINDAMYCIN PHOSPHATE 1 % EX GEL: CUTANEOUS | 3 refills | Status: AC

## 2021-09-21 MED ORDER — NITROFURANTOIN MONOHYD MACRO 100 MG PO CAPS: ORAL_CAPSULE | ORAL | 2 refills | Status: DC

## 2021-09-21 NOTE — Patient Instructions (Addendum)
Institute of Medicine Recommended Dietary Allowances for Calcium and Vitamin D  Age (yr) Calcium Recommended Dietary Allowance (mg/day) Vitamin D Recommended Dietary Allowance (international units/day)  9-18 1,300 600  19-50 1,000 600  51-70 1,200 600  71 and older 1,200 800  Data from Institute of Medicine. Dietary reference intakes: calcium, vitamin D. [place], [place]: National Academies Press; 2011.   Exercising to Stay Healthy To become healthy and stay healthy, it is recommended that you do moderate-intensity and vigorous-intensity exercise. You can tell that you are exercising at a moderate intensity if your heart starts beating faster and you start breathing faster but can still hold a conversation. You can tell that you are exercising at a vigorous intensity if you are breathing much harder and faster and cannot hold a conversation while exercising. How can exercise benefit me? Exercising regularly is important. It has many health benefits, such as: Improving overall fitness, flexibility, and endurance. Increasing bone density. Helping with weight control. Decreasing body fat. Increasing muscle strength and endurance. Reducing stress and tension, anxiety, depression, or anger. Improving overall health. What guidelines should I follow while exercising? Before you start a new exercise program, talk with your health care provider. Do not exercise so much that you hurt yourself, feel dizzy, or get very short of breath. Wear comfortable clothes and wear shoes with good support. Drink plenty of water while you exercise to prevent dehydration or heat stroke. Work out until your breathing and your heartbeat get faster (moderate intensity). How often should I exercise? Choose an activity that you enjoy, and set realistic goals. Your health care provider can help you make an activity plan that is individually designed and works best for you. Exercise regularly as told by your health  care provider. This may include: Doing strength training two times a week, such as: Lifting weights. Using resistance bands. Push-ups. Sit-ups. Yoga. Doing a certain intensity of exercise for a given amount of time. Choose from these options: A total of 150 minutes of moderate-intensity exercise every week. A total of 75 minutes of vigorous-intensity exercise every week. A mix of moderate-intensity and vigorous-intensity exercise every week. Children, pregnant women, people who have not exercised regularly, people who are overweight, and older adults may need to talk with a health care provider about what activities are safe to perform. If you have a medical condition, be sure to talk with your health care provider before you start a new exercise program. What are some exercise ideas? Moderate-intensity exercise ideas include: Walking 1 mile (1.6 km) in about 15 minutes. Biking. Hiking. Golfing. Dancing. Water aerobics. Vigorous-intensity exercise ideas include: Walking 4.5 miles (7.2 km) or more in about 1 hour. Jogging or running 5 miles (8 km) in about 1 hour. Biking 10 miles (16.1 km) or more in about 1 hour. Lap swimming. Roller-skating or in-line skating. Cross-country skiing. Vigorous competitive sports, such as football, basketball, and soccer. Jumping rope. Aerobic dancing. What are some everyday activities that can help me get exercise? Yard work, such as: Pushing a lawn mower. Raking and bagging leaves. Washing your car. Pushing a stroller. Shoveling snow. Gardening. Washing windows or floors. How can I be more active in my day-to-day activities? Use stairs instead of an elevator. Take a walk during your lunch break. If you drive, park your car farther away from your work or school. If you take public transportation, get off one stop early and walk the rest of the way. Stand up or walk around during all of   your indoor phone calls. Get up, stretch, and walk  around every 30 minutes throughout the day. Enjoy exercise with a friend. Support to continue exercising will help you keep a regular routine of activity. Where to find more information You can find more information about exercising to stay healthy from: U.S. Department of Health and Human Services: www.hhs.gov Centers for Disease Control and Prevention (CDC): www.cdc.gov Summary Exercising regularly is important. It will improve your overall fitness, flexibility, and endurance. Regular exercise will also improve your overall health. It can help you control your weight, reduce stress, and improve your bone density. Do not exercise so much that you hurt yourself, feel dizzy, or get very short of breath. Before you start a new exercise program, talk with your health care provider. This information is not intended to replace advice given to you by your health care provider. Make sure you discuss any questions you have with your health care provider. Document Revised: 04/10/2021 Document Reviewed: 04/10/2021 Elsevier Patient Education  2022 [employer] Inc. Budget-Friendly Healthy Eating There are many ways to save money at the grocery store and continue to eat healthy. You can be successful if you: Plan meals according to your budget. Make a grocery list and only purchase food according to your grocery list. Prepare food yourself at home. What are tips for following this plan? Reading food labels Compare food labels between brand name foods and the store brand. Often the nutritional value is the same, but the store brand is lower cost. Look for products that do not have added sugar, fat, or salt (sodium). These often cost the same but are healthier for you. Products may be labeled as: Sugar-free. Nonfat. Low-fat. Sodium-free. Low-sodium. Look for lean ground beef labeled as at least 92% lean and 8% fat. Shopping  Buy only the items on your grocery list and go only to the areas of the store  that have the items on your list. Use coupons only for foods and brands you normally buy. Avoid buying items you wouldn't normally buy simply because they are on sale. Check online and in newspapers for weekly deals. Buy healthy items from the bulk bins when available, such as herbs, spices, flour, pasta, nuts, and dried fruit. Buy fruits and vegetables that are in season. Prices are usually lower on in-season produce. Look at the unit price on the price tag. Use it to compare different brands and sizes to find out which item is the best deal. Choose healthy items that are often low-cost, such as carrots, potatoes, apples, bananas, and oranges. Dried or canned beans are a low-cost protein source. Buy in bulk and freeze extra food. Items you can buy in bulk include meats, fish, poultry, frozen fruits, and frozen vegetables. Avoid buying "ready-to-eat" foods, such as pre-cut fruits and vegetables and pre-made salads. If possible, shop around to discover where you can find the best prices. Consider other retailers such as dollar stores, larger wholesale stores, local fruit and vegetable stands, and farmers markets. Do not shop when you are hungry. If you shop while hungry, it may be hard to stick to your list and budget. Resist impulse buying. Use your grocery list as your official plan for the week. Buy a variety of vegetables and fruits by purchasing fresh, frozen, and canned items. Look at the top and bottom shelves for deals. Foods at eye level (eye level of an adult or child) are usually more expensive. Be efficient with your time when shopping. The more time you   spend at the store, the more money you are likely to spend. To save money when choosing more expensive foods like meats and dairy: Choose cheaper cuts of meat, such as bone-in chicken thighs and drumsticks instead of skinless and boneless chicken. When you are ready to prepare the chicken, you can remove the skin yourself to make it  healthier. Choose lean meats like chicken or turkey instead of beef. Choose canned seafood, such as tuna, salmon, or sardines. Buy eggs as a low-cost source of protein. Buy dried beans and peas, such as lentils, split peas, or kidney beans instead of meats. Dried beans and peas are a good alternative source of protein. Buy the larger tubs of yogurt instead of individual-sized containers. Choose water instead of sodas and other sweetened beverages. Avoid buying chips, cookies, and other "junk food." These items are usually expensive and not healthy. Cooking Make extra food and freeze the extras in meal-sized containers or in individual portions for fast meals and snacks. Pre-cook on days when you have extra time to prepare meals in advance. You can keep these meals in the fridge or freezer and reheat for a quick meal. When you come home from the grocery store, wash, peel, and cut fruits and vegetables so they are ready to use and eat. This will help reduce food waste. Meal planning Do not eat out or get fast food. Prepare food at home. Make a grocery list and make sure to bring it with you to the store. If you have a smart phone, you could use your phone to create your shopping list. Plan meals and snacks according to a grocery list and budget you create. Use leftovers in your meal plan for the week. Look for recipes where you can cook once and make enough food for two meals. Prepare budget-friendly types of meals like stews, casseroles, and stir-fry dishes. Try some meatless meals or try "no cook" meals like salads. Make sure that half your plate is filled with fruits or vegetables. Choose from fresh, frozen, or canned fruits and vegetables. If eating canned, remember to rinse them before eating. This will remove any excess salt added for packaging. Summary Eating healthy on a budget is possible if you plan your meals according to your budget, purchase according to your budget and grocery list,  and prepare food yourself. Tips for buying more food on a limited budget include buying generic brands, using coupons only for foods you normally buy, and buying healthy items from the bulk bins when available. Tips for buying cheaper food to replace expensive food include choosing cheaper, lean cuts of meat, and buying dried beans and peas. This information is not intended to replace advice given to you by your health care provider. Make sure you discuss any questions you have with your health care provider. Document Revised: 09/25/2020 Document Reviewed: 09/25/2020 Elsevier Patient Education  2022 Elsevier Inc. Bone Health Bones protect organs, store calcium, anchor muscles, and support the whole body. Keeping your bones strong is important, especially as you get older. You can take actions to help keep your bones strong and healthy. Why is keeping my bones healthy important? Keeping your bones healthy is important because your body constantly replaces bone cells. Cells get old, and new cells take their place. As we age, we lose bone cells because the body may not be able to make enough new cells to replace the old cells. The amount of bone cells and bone tissue you have is referred to as   bone mass. The higher your bone mass, the stronger your bones. The aging process leads to an overall loss of bone mass in the body, which can increase the likelihood of: Joint pain and stiffness. Broken bones. A condition in which the bones become weak and brittle (osteoporosis). A large decline in bone mass occurs in older adults. In women, it occurs about the time of menopause. What actions can I take to keep my bones healthy? Good health habits are important for maintaining healthy bones. This includes eating nutritious foods and exercising regularly. To have healthy bones, you need to get enough of the right minerals and vitamins. Most nutrition experts recommend getting these nutrients from the foods that  you eat. In some cases, taking supplements may also be recommended. Doing certain types of exercise is also important for bone health. What are the nutritional recommendations for healthy bones? Eating a well-balanced diet with plenty of calcium and vitamin D will help to protect your bones. Nutritional recommendations vary from person to person. Ask your health care provider what is healthy for you. Here are some general guidelines. Get enough calcium Calcium is the most important (essential) mineral for bone health. Most people can get enough calcium from their diet, but supplements may be recommended for people who are at risk for osteoporosis. Good sources of calcium include: Dairy products, such as low-fat or nonfat milk, cheese, and yogurt. Dark green leafy vegetables, such as bok choy and broccoli. Calcium-fortified foods, such as orange juice, cereal, bread, soy beverages, and tofu products. Nuts, such as almonds. Follow these recommended amounts for daily calcium intake: Children, age 79-3: 700 mg. Children, age 54-8: 1,000 mg. Children, age 63-13: 1,300 mg. Teens, age 55-18: 1,300 mg. Adults, age 92-50: 1,000 mg. Adults, age 60-70: Men: 1,000 mg. Women: 1,200 mg. Adults, age 66 or older: 1,200 mg. Pregnant and breastfeeding females: Teens: 1,300 mg. Adults: 1,000 mg. Get enough vitamin D Vitamin D is the most essential vitamin for bone health. It helps the body absorb calcium. Sunlight stimulates the skin to make vitamin D, so be sure to get enough sunlight. If you live in a cold climate or you do not get outside often, your health care provider may recommend that you take vitamin D supplements. Good sources of vitamin D in your diet include: Egg yolks. Saltwater fish. Milk and cereal fortified with vitamin D. Follow these recommended amounts for daily vitamin D intake: Children and teens, age 79-18: 600 international units. Adults, age 796 or younger: 400-800 international  units. Adults, age 34 or older: 800-1,000 international units. Get other important nutrients Other nutrients that are important for bone health include: Phosphorus. This mineral is found in meat, poultry, dairy foods, nuts, and legumes. The recommended daily intake for adult men and adult women is 700 mg. Magnesium. This mineral is found in seeds, nuts, dark green vegetables, and legumes. The recommended daily intake for adult men is 400-420 mg. For adult women, it is 310-320 mg. Vitamin K. This vitamin is found in green leafy vegetables. The recommended daily intake is 120 mg for adult men and 90 mg for adult women. What type of physical activity is best for building and maintaining healthy bones? Weight-bearing and strength-building activities are important for building and maintaining healthy bones. Weight-bearing activities cause muscles and bones to work against gravity. Strength-building activities increase the strength of the muscles that support bones. Weight-bearing and muscle-building activities include: Walking and hiking. Jogging and running. Dancing. Gym exercises. Lifting weights. Tennis  and racquetball. Climbing stairs. Aerobics. Adults should get at least 30 minutes of moderate physical activity on most days. Children should get at least 60 minutes of moderate physical activity on most days. Ask your health care provider what type of exercise is best for you. How can I find out if my bone mass is low? Bone mass can be measured with an X-ray test called a bone mineral density (BMD) test. This test is recommended for all women who are age 50 or older. It may also be recommended for: Men who are age 66 or older. People who are at risk for osteoporosis because of: Having bones that break easily. Having a long-term disease that weakens bones, such as kidney disease or rheumatoid arthritis. Having menopause earlier than normal. Taking medicine that weakens bones, such as steroids,  thyroid hormones, or hormone treatment for breast cancer or prostate cancer. Smoking. Drinking three or more alcoholic drinks a day. If you find that you have a low bone mass, you may be able to prevent osteoporosis or further bone loss by changing your diet and lifestyle. Where can I find more information? For more information, check out the following websites: [place]: [employer] of Health: www.bones.SouthExposed.es International Osteoporosis Foundation: Administrator.iofbonehealth.org Summary The aging process leads to an overall loss of bone mass in the body, which can increase the likelihood of broken bones and osteoporosis. Eating a well-balanced diet with plenty of calcium and vitamin D will help to protect your bones. Weight-bearing and strength-building activities are also important for building and maintaining strong bones. Bone mass can be measured with an X-ray test called a bone mineral density (BMD) test. This information is not intended to replace advice given to you by your health care provider. Make sure you discuss any questions you have with your health care provider. Document Revised: 01/09/2018 Document Reviewed: 01/09/2018 Elsevier Patient Education  2022 [place]. Hidradenitis Suppurativa Hidradenitis suppurativa is a long-term (chronic) skin disease. It is similar to a severe form of acne, but it affects areas of the body where acne would be unusual, especially areas of the body where skin rubs against skin and becomes moist. These include: Underarms. Groin. Genital area. Buttocks. Upper thighs. Breasts. Hidradenitis suppurativa may start out as small lumps or pimples caused by blocked sweat glands or hair follicles. Pimples may develop into deep sores that break open (rupture) and drain pus. Over time, affected areas of skin may thicken and become scarred. This condition is rare and does not spread from person to person  (non-contagious). What are the causes? The exact cause of this condition is not known. It may be related to: Female and female hormones. An overactive disease-fighting system (immune system). The immune system may over-react to blocked hair follicles or sweat glands and cause swelling and pus-filled sores. What increases the risk? You are more likely to develop this condition if you: Are female. Are 1-51 years old. Have a family history of hidradenitis suppurativa. Have a personal history of acne. Are overweight. Smoke. Take the medicine lithium. What are the signs or symptoms? The first symptoms are usually painful bumps in the skin, similar to pimples. The condition may get worse over time (progress), or it may only cause mild symptoms. If the disease progresses, symptoms may include: Skin bumps getting bigger and growing deeper into the skin. Bumps rupturing and draining pus. Itchy, infected skin. Skin getting thicker and scarred. Tunnels under the skin (fistulas) where pus drains from a bump. Pain  during daily activities, such as pain during walking if your groin area is affected. Emotional problems, such as stress or depression. This condition may affect your appearance and your ability or willingness to wear certain clothes or do certain activities. How is this diagnosed? This condition is diagnosed by a health care provider who specializes in skin diseases (dermatologist). You may be diagnosed based on: Your symptoms and medical history. A physical exam. Testing a pus sample for infection. Blood tests. How is this treated? Your treatment will depend on how severe your symptoms are. The same treatment will not work for everybody with this condition. You may need to try several treatments to find what works best for you. Treatment may include: Cleaning and bandaging (dressing) your wounds as needed. Lifestyle changes, such as new skin care routines. Taking medicines, such  as: Antibiotics. Acne medicines. Medicines to reduce the activity of the immune system. A diabetes medicine (metformin). Birth control pills, for women. Steroids to reduce swelling and pain. Working with a mental health care provider, if you experience emotional distress due to this condition. If you have severe symptoms that do not get better with medicine, you may need surgery. Surgery may involve: Using a laser to clear the skin and remove hair follicles. Opening and draining deep sores. Removing the areas of skin that are diseased and scarred. Follow these instructions at home: Medicines  Take over-the-counter and prescription medicines only as told by your health care provider. If you were prescribed an antibiotic medicine, take it as told by your health care provider. Do not stop taking the antibiotic even if your condition improves. Skin care If you have open wounds, cover them with a clean dressing as told by your health care provider. Keep wounds clean by washing them gently with soap and water when you bathe. Do not shave the areas where you get hidradenitis suppurativa. Do not wear deodorant. Wear loose-fitting clothes. Try to avoid getting overheated or sweaty. If you get sweaty or wet, change into clean, dry clothes as soon as you can. To help relieve pain and itchiness, cover sore areas with a warm, clean washcloth (warm compress) for 5-10 minutes as often as needed. If told by your health care provider, take a bleach bath twice a week: Fill your bathtub halfway with water. Pour in  cup of unscented household bleach. Soak in the tub for 5-10 minutes. Only soak from the neck down. Avoid water on your face and hair. Shower to rinse off the bleach from your skin. General instructions Learn as much as you can about your disease so that you have an active role in your treatment. Work closely with your health care provider to find treatments that work for you. If you are  overweight, work with your health care provider to lose weight as recommended. Do not use any products that contain nicotine or tobacco, such as cigarettes and e-cigarettes. If you need help quitting, ask your health care provider. If you struggle with living with this condition, talk with your health care provider or work with a mental health care provider as recommended. Keep all follow-up visits as told by your health care provider. This is important. Where to find more information Hidradenitis [place].: https://www.hs-foundation.org/ American Academy of Dermatology: http://www.nguyen-hutchinson.com/ Contact a health care provider if you have: A flare-up of hidradenitis suppurativa. A fever or chills. Trouble controlling your symptoms at home. Trouble doing your daily activities because of your symptoms. Trouble dealing with emotional problems related  to your condition. Summary Hidradenitis suppurativa is a long-term (chronic) skin disease. It is similar to a severe form of acne, but it affects areas of the body where acne would be unusual. The first symptoms are usually painful bumps in the skin, similar to pimples. The condition may only cause mild symptoms, or it may get worse over time (progress). If you have open wounds, cover them with a clean dressing as told by your health care provider. Keep wounds clean by washing them gently with soap and water when you bathe. Besides skin care, treatment may include medicines, laser treatment, and surgery. This information is not intended to replace advice given to you by your health care provider. Make sure you discuss any questions you have with your health care provider. Document Revised: 10/07/2020 Document Reviewed: 10/07/2020 Elsevier Patient Education  2022 [employer].

## 2021-09-21 NOTE — Progress Notes (Signed)
Gynecology Annual Exam  PCP: Dalia Heading, CNM (Inactive)  Chief Complaint:  Chief Complaint  Patient presents with   Gynecologic Exam    History of Present Illness: Patient is a 55 y.o. G1P1001 presents for annual exam. The patient has no complaints today.   LMP: No LMP recorded. Patient is perimenopausal. She denies postmenopausal bleeding or spotting. She is experiencing some hot flashes, but they are not overly bothersome.  The patient is sexually active. She denies dyspareunia.  Postcoital Bleeding: no   The patient does perform self breast exams.  There is no notable family history of breast or ovarian cancer in her family.  The patient has regular exercise: walking and yoga 2-3 times a week  The patient denies current symptoms of depression.   PHQ-9: 5 GAD-7: 4   Review of Systems: Review of Systems  Constitutional:  Negative for chills, fever, malaise/fatigue and weight loss.  HENT:  Negative for congestion, hearing loss and sinus pain.   Eyes:  Negative for blurred vision and double vision.  Respiratory:  Negative for cough, sputum production, shortness of breath and wheezing.   Cardiovascular:  Negative for chest pain, palpitations, orthopnea and leg swelling.  Gastrointestinal:  Negative for abdominal pain, constipation, diarrhea, nausea and vomiting.  Genitourinary:  Negative for dysuria, flank pain, frequency, hematuria and urgency.  Musculoskeletal:  Negative for back pain, falls and joint pain.  Skin:  Negative for itching and rash.  Neurological:  Negative for dizziness and headaches.  Psychiatric/Behavioral:  Negative for depression, substance abuse and suicidal ideas. The patient is not nervous/anxious.    Past Medical History:  Past Medical History:  Diagnosis Date   Allergic rhinitis    Allergy    Cardiac arrhythmia 2012   work up by cardiology (sinus arrhythmia and bigeminy)   Chicken pox    GERD (gastroesophageal reflux disease)    UTI  (urinary tract infection)     Past Surgical History:  Past Surgical History:  Procedure Laterality Date   BREAST BIOPSY Left 2017   fibroadenomas   BREAST SURGERY Left 08/02/2016   fibroadenomas   HERNIA REPAIR  1972   INTRAUTERINE DEVICE (IUD) INSERTION  08/30/2012   [facility]    Gynecologic History:  No LMP recorded. Patient is perimenopausal. Last Pap: Results were: 2021 NIL  Last mammogram: 2021 Results were: BI-RAD I  Obstetric History: G1P1001  Family History:  Family History  Problem Relation Age of Onset   Heart disease Mother    Hypertension Mother    Stroke Maternal Grandmother    Parkinson's disease Maternal Grandmother    Breast cancer Neg Hx     Social History:  Social History   Socioeconomic History   Marital status: Divorced    Spouse name: Not on file   Number of children: 1   Years of education: 16   Highest education level: Not on file  Occupational History   Occupation: Environmental health practitioner  Tobacco Use   Smoking status: Never   Smokeless tobacco: Never  Vaping Use   Vaping Use: Never used  Substance and Sexual Activity   Alcohol use: Yes    Alcohol/week: 0.0 - 1.0 standard drinks    Comment: OCC   Drug use: No   Sexual activity: Yes    Partners: Male    Birth control/protection: I.U.D.  Other Topics Concern   Not on file  Social History Narrative   Not on file   Social Determinants of Health   Financial  Resource Strain: Not on file  Food Insecurity: Not on file  Transportation Needs: Not on file  Physical Activity: Not on file  Stress: Not on file  Social Connections: Not on file  Intimate Partner Violence: Not on file    Allergies:  No Known Allergies  Medications: Prior to Admission medications   Medication Sig Start Date End Date Taking? Authorizing Provider  Calcium Carb-Cholecalciferol (CALCIUM 500 + D3) 500-200 MG-UNIT TABS Take 1 tablet by mouth daily.   Yes [provider]  fexofenadine (ALLEGRA) 180 MG  tablet TAKE 1 TABLET (180 MG) BY ORAL ROUTE ONCE DAILY 02/18/16  Yes [provider]  fluticasone (FLONASE) 50 MCG/ACT nasal spray SPRAY 2 SPRAYS IN EACH NOSTRIL ONCE DAILY 02/20/19  Yes [provider]  ibuprofen (ADVIL) 800 MG tablet  08/14/19  Yes [provider]  Multiple Vitamins-Minerals (ALIVE ONCE DAILY WOMENS 50+) TABS Take 1 tablet by mouth daily.   Yes [provider]  nitrofurantoin, macrocrystal-monohydrate, (MACROBID) 100 MG capsule Take one after intercourse 01/04/20  Yes Dalia Heading, CNM    Physical Exam Vitals: Blood pressure 120/72, height 5\' 6"  (1.676 m), weight 169 lb 3.2 oz (76.7 kg).  Physical Exam Constitutional:      Appearance: She is well-developed.  Genitourinary:     Genitourinary Comments: External: Normal appearing vulva. No lesions noted.   Bimanual examination: Uterus midline, non-tender, normal in size, shape and contour.  No CMT. No adnexal masses. No adnexal tenderness. Pelvis not fixed.  Breast Exam: breast equal without skin changes, nipple discharge, breast lump or enlarged lymph nodes   HENT:     Head: Normocephalic and atraumatic.  Neck:     Thyroid: No thyromegaly.  Cardiovascular:     Rate and Rhythm: Normal rate and regular rhythm.     Heart sounds: Normal heart sounds.  Pulmonary:     Effort: Pulmonary effort is normal.     Breath sounds: Normal breath sounds.  Abdominal:     General: Bowel sounds are normal. There is no distension.     Palpations: Abdomen is soft. There is no mass.  Musculoskeletal:     Cervical back: Neck supple.  Neurological:     Mental Status: She is alert and oriented to person, place, and time.  Skin:    General: Skin is warm and dry.  Psychiatric:        Behavior: Behavior normal.        Thought Content: Thought content normal.        Judgment: Judgment normal.  Vitals reviewed.     Female chaperone present for pelvic and breast  portions of the physical  exam  Assessment: 55 y.o. G1P1001 routine annual exam  Plan: Problem List Items Addressed This Visit   None Visit Diagnoses     Encounter for gynecological examination without abnormal finding    -  Primary   Encounter for annual routine gynecological examination       Health maintenance examination       Breast cancer screening by mammogram       Relevant Orders   MM 3D SCREEN BREAST BILATERAL   Colon cancer screening       Cervical cancer screening       Hidradenitis suppurativa of left axilla       Relevant Medications   clindamycin (CLINDAGEL) 1 % gel   nitrofurantoin, macrocrystal-monohydrate, (MACROBID) 100 MG capsule       1) Mammogram - recommend yearly screening mammogram.  Mammogram Was ordered today  2) STI screening was offered and declined.  3) ASCCP guidelines and rational discussed.  Patient opts for every 3 years screening interval  4) Colonoscopy -- cologuard up to date.  5) Routine healthcare maintenance including cholesterol, diabetes screening discussed  - she does these through her job, reports largely normal.  6) Osteoporosis screening - history of osteopenia, repeat in 2026 for monitoring.    7) Refill for macrobid sent.   8) Hidradenitis suppurativa-rx for topical clindamycin sent. Discussed hibiclens usage.    Adrian Prows MD, [facility], [facility] 09/21/2021 3:28 PM

## 2021-10-07 ENCOUNTER — Ambulatory Visit
Admission: RE | Admit: 2021-10-07 | Discharge: 2021-10-07 | Disposition: A | Discharge status: Home / Self Care | Payer: BC Managed Care – PPO | Source: Ambulatory Visit | Attending: Obstetrics and Gynecology | Admitting: Obstetrics and Gynecology

## 2021-10-07 ENCOUNTER — Other Ambulatory Visit: Payer: Self-pay

## 2021-10-07 DIAGNOSIS — Z1231 Encounter for screening mammogram for malignant neoplasm of breast: Secondary | ICD-10-CM | POA: Insufficient documentation

## 2021-10-14 DIAGNOSIS — M2142 Flat foot [pes planus] (acquired), left foot: Secondary | ICD-10-CM | POA: Diagnosis not present

## 2021-10-14 DIAGNOSIS — S92515A Nondisplaced fracture of proximal phalanx of left lesser toe(s), initial encounter for closed fracture: Secondary | ICD-10-CM | POA: Diagnosis not present

## 2021-10-14 DIAGNOSIS — M25572 Pain in left ankle and joints of left foot: Secondary | ICD-10-CM | POA: Diagnosis not present

## 2021-10-30 DIAGNOSIS — D2271 Melanocytic nevi of right lower limb, including hip: Secondary | ICD-10-CM | POA: Diagnosis not present

## 2021-10-30 DIAGNOSIS — L815 Leukoderma, not elsewhere classified: Secondary | ICD-10-CM | POA: Diagnosis not present

## 2021-10-30 DIAGNOSIS — D2261 Melanocytic nevi of right upper limb, including shoulder: Secondary | ICD-10-CM | POA: Diagnosis not present

## 2021-10-30 DIAGNOSIS — C44612 Basal cell carcinoma of skin of right upper limb, including shoulder: Secondary | ICD-10-CM | POA: Diagnosis not present

## 2021-10-30 DIAGNOSIS — D225 Melanocytic nevi of trunk: Secondary | ICD-10-CM | POA: Diagnosis not present

## 2021-10-30 DIAGNOSIS — D485 Neoplasm of uncertain behavior of skin: Secondary | ICD-10-CM | POA: Diagnosis not present

## 2021-12-07 DIAGNOSIS — Z23 Encounter for immunization: Secondary | ICD-10-CM | POA: Diagnosis not present

## 2022-01-01 DIAGNOSIS — C44612 Basal cell carcinoma of skin of right upper limb, including shoulder: Secondary | ICD-10-CM | POA: Diagnosis not present

## 2022-01-01 DIAGNOSIS — L905 Scar conditions and fibrosis of skin: Secondary | ICD-10-CM | POA: Diagnosis not present

## 2022-04-26 DIAGNOSIS — D2261 Melanocytic nevi of right upper limb, including shoulder: Secondary | ICD-10-CM | POA: Diagnosis not present

## 2022-04-26 DIAGNOSIS — Z789 Other specified health status: Secondary | ICD-10-CM | POA: Diagnosis not present

## 2022-04-26 DIAGNOSIS — L905 Scar conditions and fibrosis of skin: Secondary | ICD-10-CM | POA: Diagnosis not present

## 2022-04-26 DIAGNOSIS — D225 Melanocytic nevi of trunk: Secondary | ICD-10-CM | POA: Diagnosis not present

## 2022-04-26 DIAGNOSIS — D2262 Melanocytic nevi of left upper limb, including shoulder: Secondary | ICD-10-CM | POA: Diagnosis not present

## 2022-04-26 DIAGNOSIS — L538 Other specified erythematous conditions: Secondary | ICD-10-CM | POA: Diagnosis not present

## 2022-04-26 DIAGNOSIS — L918 Other hypertrophic disorders of the skin: Secondary | ICD-10-CM | POA: Diagnosis not present

## 2022-05-03 DIAGNOSIS — Z0189 Encounter for other specified special examinations: Secondary | ICD-10-CM | POA: Diagnosis not present

## 2022-05-06 DIAGNOSIS — R748 Abnormal levels of other serum enzymes: Secondary | ICD-10-CM | POA: Diagnosis not present

## 2022-05-06 DIAGNOSIS — Z713 Dietary counseling and surveillance: Secondary | ICD-10-CM | POA: Diagnosis not present

## 2022-05-06 DIAGNOSIS — Z043 Encounter for examination and observation following other accident: Secondary | ICD-10-CM | POA: Diagnosis not present

## 2022-10-04 ENCOUNTER — Other Ambulatory Visit (HOSPITAL_COMMUNITY): Payer: Self-pay | Admitting: Cardiology

## 2022-10-04 DIAGNOSIS — Z8249 Family history of ischemic heart disease and other diseases of the circulatory system: Secondary | ICD-10-CM

## 2022-10-08 ENCOUNTER — Ambulatory Visit
Admission: RE | Admit: 2022-10-08 | Discharge: 2022-10-08 | Disposition: A | Discharge status: Home / Self Care | Payer: BC Managed Care – PPO | Source: Ambulatory Visit | Attending: Cardiology | Admitting: Cardiology

## 2022-10-08 DIAGNOSIS — Z8249 Family history of ischemic heart disease and other diseases of the circulatory system: Secondary | ICD-10-CM | POA: Insufficient documentation

## 2022-11-09 DIAGNOSIS — D2371 Other benign neoplasm of skin of right lower limb, including hip: Secondary | ICD-10-CM | POA: Diagnosis not present

## 2022-11-09 DIAGNOSIS — D235 Other benign neoplasm of skin of trunk: Secondary | ICD-10-CM | POA: Diagnosis not present

## 2022-11-09 DIAGNOSIS — L578 Other skin changes due to chronic exposure to nonionizing radiation: Secondary | ICD-10-CM | POA: Diagnosis not present

## 2022-11-09 DIAGNOSIS — L910 Hypertrophic scar: Secondary | ICD-10-CM | POA: Diagnosis not present

## 2022-11-09 DIAGNOSIS — Z08 Encounter for follow-up examination after completed treatment for malignant neoplasm: Secondary | ICD-10-CM | POA: Diagnosis not present

## 2022-12-28 DIAGNOSIS — J0100 Acute maxillary sinusitis, unspecified: Secondary | ICD-10-CM | POA: Diagnosis not present

## 2023-04-04 NOTE — Progress Notes (Unsigned)
PCP: Pcp, No   No chief complaint on file.   HPI:      [name] is a 57 y.o. G1P1001 whose LMP was No LMP recorded. Patient is perimenopausal., presents today for her annual examination.  Her menses are absent due to menopause. No PMB. She {does:18564} have vasomotor sx.   Sex activity: {sex active: 315163}. She {does:18564} have vaginal dryness.  Last Pap: 01/04/20 Results were: no abnormalities /neg HPV DNA.  Hx of STDs: {STD hx:14358}  Last mammogram: 10/07/21  Results were: normal--routine follow-up in 12 months There is no FH of breast cancer. There is no FH of ovarian cancer. The patient {does:18564} do self-breast exams.  Colonoscopy: never; neg Cologuard 3/21; Repeat due after 3 years.   Tobacco use: {tob:20664} Alcohol use: {Alcohol:11675} No drug use Exercise: {exercise:31265}  She {does:18564} get adequate calcium and Vitamin D in her diet.  Labs with PCP.   Patient Active Problem List   Diagnosis Date Noted   Osteopenia after menopause 05/14/2020   Vasomotor symptoms due to menopause 01/05/2020   Recurrent UTI 09/29/2017   Cardiac arrhythmia 09/29/2017   Allergic rhinitis 09/29/2017   Fibroadenoma of breast determined by biopsy 08/03/2016   Tick bite 05/17/2016   Chronic cough 05/17/2016   Pneumonitis 04/30/2016    Past Surgical History:  Procedure Laterality Date   BREAST BIOPSY Left 2017   fibroadenomas   BREAST SURGERY Left 08/02/2016   fibroadenomas   HERNIA REPAIR  1972   INTRAUTERINE DEVICE (IUD) INSERTION  08/30/2012   [facility]    Family History  Problem Relation Age of Onset   Heart disease Mother    Hypertension Mother    Stroke Maternal Grandmother    Parkinson's disease Maternal Grandmother    Breast cancer Neg Hx     Social History   Socioeconomic History   Marital status: Divorced    Spouse name: Not on file   Number of children: 1   Years of education: 16   Highest education level: Not on file  Occupational  History   Occupation: Nurse, children's  Tobacco Use   Smoking status: Never   Smokeless tobacco: Never  Vaping Use   Vaping Use: Never used  Substance and Sexual Activity   Alcohol use: Yes    Alcohol/week: 0.0 - 1.0 standard drinks of alcohol    Comment: OCC   Drug use: No   Sexual activity: Yes    Partners: Male    Birth control/protection: I.U.D.  Other Topics Concern   Not on file  Social History Narrative   Not on file   Social Determinants of Health   Financial Resource Strain: Not on file  Food Insecurity: Not on file  Transportation Needs: Not on file  Physical Activity: Not on file  Stress: Not on file  Social Connections: Not on file  Intimate Partner Violence: Not on file     Current Outpatient Medications:    Calcium Carb-Cholecalciferol (CALCIUM 500 + D3) 500-200 MG-UNIT TABS, Take 1 tablet by mouth daily., Disp: , Rfl:    fexofenadine (ALLEGRA) 180 MG tablet, TAKE 1 TABLET (180 MG) BY ORAL ROUTE ONCE DAILY, Disp: , Rfl: 3   fluticasone (FLONASE) 50 MCG/ACT nasal spray, SPRAY 2 SPRAYS IN EACH NOSTRIL ONCE DAILY, Disp: , Rfl:    ibuprofen (ADVIL) 800 MG tablet, , Disp: , Rfl:    Multiple Vitamins-Minerals (ALIVE ONCE DAILY WOMENS 50+) TABS, Take 1 tablet by mouth daily., Disp: , Rfl:  nitrofurantoin, macrocrystal-monohydrate, (MACROBID) 100 MG capsule, Take one after intercourse, Disp: 30 capsule, Rfl: 2     ROS:  Review of Systems BREAST: No symptoms    Objective: There were no vitals taken for this visit.   OBGyn Exam  Results: No results found for this or any previous visit (from the past 24 hour(s)).  Assessment/Plan:  No diagnosis found.   No orders of the defined types were placed in this encounter.           GYN counsel {counseling: 16159}    F/U  No follow-ups on file.  Asusena Sigley B. Shondale Quinley, PA-C 04/04/2023 8:11 PM

## 2023-04-05 ENCOUNTER — Encounter: Payer: Self-pay | Admitting: Obstetrics and Gynecology

## 2023-04-05 ENCOUNTER — Other Ambulatory Visit (HOSPITAL_COMMUNITY)
Admission: RE | Admit: 2023-04-05 | Discharge: 2023-04-05 | Disposition: A | Discharge status: Home / Self Care | Payer: BC Managed Care – PPO | Source: Ambulatory Visit | Attending: Obstetrics and Gynecology | Admitting: Obstetrics and Gynecology

## 2023-04-05 ENCOUNTER — Ambulatory Visit (INDEPENDENT_AMBULATORY_CARE_PROVIDER_SITE_OTHER): Payer: BC Managed Care – PPO | Admitting: Obstetrics and Gynecology

## 2023-04-05 VITALS — BP 130/70 | Ht 66.0 in | Wt 165.0 lb

## 2023-04-05 DIAGNOSIS — Z1151 Encounter for screening for human papillomavirus (HPV): Secondary | ICD-10-CM

## 2023-04-05 DIAGNOSIS — Z124 Encounter for screening for malignant neoplasm of cervix: Secondary | ICD-10-CM | POA: Insufficient documentation

## 2023-04-05 DIAGNOSIS — N390 Urinary tract infection, site not specified: Secondary | ICD-10-CM

## 2023-04-05 DIAGNOSIS — Z1231 Encounter for screening mammogram for malignant neoplasm of breast: Secondary | ICD-10-CM

## 2023-04-05 DIAGNOSIS — Z01419 Encounter for gynecological examination (general) (routine) without abnormal findings: Secondary | ICD-10-CM | POA: Diagnosis not present

## 2023-04-05 DIAGNOSIS — Z1211 Encounter for screening for malignant neoplasm of colon: Secondary | ICD-10-CM

## 2023-04-05 MED ORDER — NITROFURANTOIN MONOHYD MACRO 100 MG PO CAPS: ORAL_CAPSULE | ORAL | 2 refills | Status: DC

## 2023-04-05 NOTE — Patient Instructions (Addendum)
I value your feedback and you entrusting us with your care. If you get a [facility] patient survey, I would appreciate you taking the time to let us know about your experience today. Thank you!  [facility]: [phone]      

## 2023-04-07 LAB — CYTOLOGY - PAP
Comment: NEGATIVE
Diagnosis: NEGATIVE
High risk HPV: NEGATIVE

## 2023-04-13 ENCOUNTER — Encounter: Payer: Self-pay | Admitting: *Deleted

## 2023-05-06 ENCOUNTER — Ambulatory Visit
Admission: RE | Admit: 2023-05-06 | Discharge: 2023-05-06 | Disposition: A | Discharge status: Home / Self Care | Payer: BC Managed Care – PPO | Source: Ambulatory Visit | Attending: Obstetrics and Gynecology | Admitting: Obstetrics and Gynecology

## 2023-05-06 DIAGNOSIS — Z1231 Encounter for screening mammogram for malignant neoplasm of breast: Secondary | ICD-10-CM | POA: Diagnosis not present

## 2023-06-23 ENCOUNTER — Other Ambulatory Visit: Payer: Self-pay | Admitting: *Deleted

## 2023-06-23 ENCOUNTER — Telehealth: Payer: Self-pay | Admitting: *Deleted

## 2023-06-23 DIAGNOSIS — Z1211 Encounter for screening for malignant neoplasm of colon: Secondary | ICD-10-CM

## 2023-06-23 MED ORDER — NA SULFATE-K SULFATE-MG SULF 17.5-3.13-1.6 GM/177ML PO SOLN: 1.0000 | Freq: Once | ORAL | 0 refills | Status: AC

## 2023-06-23 NOTE — Telephone Encounter (Signed)
Gastroenterology Pre-Procedure Review  Request Date: 08/16/2023 Requesting Physician: Dr. Allegra Lai  PATIENT REVIEW QUESTIONS: The patient responded to the following health history questions as indicated:    1. Are you having any GI issues? no 2. Do you have a personal history of Polyps? no 3. Do you have a family history of Colon Cancer or Polyps? no 4. Diabetes Mellitus? no 5. Joint replacements in the past 12 months?no 6. Major health problems in the past 3 months?no 7. Any artificial heart valves, MVP, or defibrillator?no    MEDICATIONS & ALLERGIES:    Patient reports the following regarding taking any anticoagulation/antiplatelet therapy:   Plavix, Coumadin, Eliquis, Xarelto, Lovenox, Pradaxa, Brilinta, or Effient? no Aspirin? no  Patient confirms/reports the following medications:  Current Outpatient Medications  Medication Sig Dispense Refill   Na Sulfate-K Sulfate-Mg Sulf 17.5-3.13-1.6 GM/177ML SOLN Take 1 kit by mouth once for 1 dose. 354 mL 0   Calcium Carb-Cholecalciferol (CALCIUM 500 + D3) 500-200 MG-UNIT TABS Take 1 tablet by mouth daily.     fexofenadine (ALLEGRA) 180 MG tablet TAKE 1 TABLET (180 MG) BY ORAL ROUTE ONCE DAILY  3   fluticasone (FLONASE) 50 MCG/ACT nasal spray SPRAY 2 SPRAYS IN EACH NOSTRIL ONCE DAILY     Multiple Vitamins-Minerals (ALIVE ONCE DAILY WOMENS 50+) TABS Take 1 tablet by mouth daily.     nitrofurantoin, macrocrystal-monohydrate, (MACROBID) 100 MG capsule Take one after intercourse 30 capsule 2   No current facility-administered medications for this visit.    Patient confirms/reports the following allergies:  No Known Allergies  No orders of the defined types were placed in this encounter.   AUTHORIZATION INFORMATION Primary Insurance: 1D#: Group #:  Secondary Insurance: 1D#: Group #:  SCHEDULE INFORMATION: Date: 08/16/2023 Time: Location:  [facility]

## 2023-08-02 DIAGNOSIS — H6982 Other specified disorders of Eustachian tube, left ear: Secondary | ICD-10-CM | POA: Diagnosis not present

## 2023-08-16 ENCOUNTER — Encounter
Admission: RE | Disposition: A | Discharge status: Home / Self Care | Payer: Self-pay | Source: Home / Self Care | Attending: Gastroenterology

## 2023-08-16 ENCOUNTER — Ambulatory Visit: Payer: BC Managed Care – PPO

## 2023-08-16 ENCOUNTER — Encounter: Payer: Self-pay | Admitting: Gastroenterology

## 2023-08-16 ENCOUNTER — Ambulatory Visit
Admission: RE | Admit: 2023-08-16 | Discharge: 2023-08-16 | Disposition: A | Discharge status: Home / Self Care | Payer: BC Managed Care – PPO | Source: Home / Self Care | Attending: Gastroenterology | Admitting: Gastroenterology

## 2023-08-16 DIAGNOSIS — D124 Benign neoplasm of descending colon: Secondary | ICD-10-CM | POA: Diagnosis not present

## 2023-08-16 DIAGNOSIS — D123 Benign neoplasm of transverse colon: Secondary | ICD-10-CM | POA: Diagnosis not present

## 2023-08-16 DIAGNOSIS — Z1211 Encounter for screening for malignant neoplasm of colon: Secondary | ICD-10-CM | POA: Insufficient documentation

## 2023-08-16 DIAGNOSIS — K644 Residual hemorrhoidal skin tags: Secondary | ICD-10-CM | POA: Insufficient documentation

## 2023-08-16 DIAGNOSIS — K635 Polyp of colon: Secondary | ICD-10-CM

## 2023-08-16 DIAGNOSIS — K219 Gastro-esophageal reflux disease without esophagitis: Secondary | ICD-10-CM | POA: Insufficient documentation

## 2023-08-16 DIAGNOSIS — K649 Unspecified hemorrhoids: Secondary | ICD-10-CM | POA: Diagnosis not present

## 2023-08-16 HISTORY — PX: COLONOSCOPY WITH PROPOFOL: SHX5780

## 2023-08-16 HISTORY — PX: POLYPECTOMY: SHX5525

## 2023-08-16 SURGERY — COLONOSCOPY WITH PROPOFOL
Anesthesia: General

## 2023-08-16 MED ORDER — LIDOCAINE HCL (PF) 2 % IJ SOLN
INTRAMUSCULAR | Status: AC
  Filled 2023-08-16: qty 5

## 2023-08-16 MED ORDER — LIDOCAINE HCL (CARDIAC) PF 100 MG/5ML IV SOSY
PREFILLED_SYRINGE | INTRAVENOUS | Status: DC | PRN
  Administered 2023-08-16: 50 mg via INTRAVENOUS

## 2023-08-16 MED ORDER — PROPOFOL 10 MG/ML IV BOLUS
INTRAVENOUS | Status: AC
  Filled 2023-08-16: qty 40

## 2023-08-16 MED ORDER — SODIUM CHLORIDE 0.9 % IV SOLN: INTRAVENOUS | Status: DC

## 2023-08-16 MED ORDER — PROPOFOL 10 MG/ML IV BOLUS
INTRAVENOUS | Status: DC | PRN
  Administered 2023-08-16: 100 ug/kg/min via INTRAVENOUS
  Administered 2023-08-16: 70 mg via INTRAVENOUS
  Administered 2023-08-16: 80 mg via INTRAVENOUS

## 2023-08-16 NOTE — H&P (Signed)
Arlyss Repress, MD [address]  [address]  [place], [place] [address]  Main: [phone]  Fax: [phone] Pager: [phone]  Primary Care Physician:  Patient, No Pcp Per Primary Gastroenterologist:  Dr. Arlyss Repress  Pre-Procedure History & Physical: HPI:  Carol Jordan is a 57 y.o. female is here for an colonoscopy.   Past Medical History:  Diagnosis Date   Allergic rhinitis    Allergy    Cardiac arrhythmia 2012   work up by cardiology (sinus arrhythmia and bigeminy)   Chicken pox    GERD (gastroesophageal reflux disease)    UTI (urinary tract infection)     Past Surgical History:  Procedure Laterality Date   BREAST BIOPSY Left 2017   fibroadenomas   BREAST SURGERY Left 08/02/2016   fibroadenomas   HERNIA REPAIR  1972   INTRAUTERINE DEVICE (IUD) INSERTION  08/30/2012   [facility]    Prior to Admission medications   Medication Sig Start Date End Date Taking? Authorizing Provider  Calcium Carb-Cholecalciferol (CALCIUM 500 + D3) 500-200 MG-UNIT TABS Take 1 tablet by mouth daily.    [provider]  fexofenadine (ALLEGRA) 180 MG tablet TAKE 1 TABLET (180 MG) BY ORAL ROUTE ONCE DAILY 02/18/16   [provider]  fluticasone (FLONASE) 50 MCG/ACT nasal spray SPRAY 2 SPRAYS IN EACH NOSTRIL ONCE DAILY 02/20/19   [provider]  Multiple Vitamins-Minerals (ALIVE ONCE DAILY WOMENS 50+) TABS Take 1 tablet by mouth daily.    [provider]  nitrofurantoin, macrocrystal-monohydrate, (MACROBID) 100 MG capsule Take one after intercourse 04/05/23   Copland, Alicia B, PA-C    Allergies as of 06/23/2023   (No Known Allergies)    Family History  Problem Relation Age of Onset   Heart disease Mother    Hypertension Mother    Stroke Maternal Grandmother    Parkinson's disease Maternal Grandmother    Breast cancer Neg Hx     Social History   Socioeconomic History   Marital status: Divorced    Spouse name: Not on file   Number of  children: 1   Years of education: 16   Highest education level: Not on file  Occupational History   Occupation: Nurse, children's  Tobacco Use   Smoking status: Never   Smokeless tobacco: Never  Vaping Use   Vaping status: Never Used  Substance and Sexual Activity   Alcohol use: Yes    Alcohol/week: 0.0 - 1.0 standard drinks of alcohol    Comment: OCC   Drug use: No   Sexual activity: Yes    Partners: Male  Other Topics Concern   Not on file  Social History Narrative   Not on file   Social Determinants of Health   Financial Resource Strain: Not on file  Food Insecurity: Not on file  Transportation Needs: Not on file  Physical Activity: Not on file  Stress: Not on file  Social Connections: Not on file  Intimate Partner Violence: Not on file    Review of Systems: See HPI, otherwise negative ROS  Physical Exam: BP (!) 151/86   Pulse 65   Temp (!) 96.8 F (36 C) (Temporal)   Resp 17   Ht 5\' 6"  (1.676 m)   Wt 74.8 kg   SpO2 98%   BMI 26.63 kg/m  General:   Alert,  pleasant and cooperative in NAD Head:  Normocephalic and atraumatic. Neck:  Supple; no masses or thyromegaly. Lungs:  Clear throughout to auscultation.  Heart:  Regular rate and rhythm. Abdomen:  Soft, nontender and nondistended. Normal bowel sounds, without guarding, and without rebound.   Neurologic:  Alert and  oriented x4;  grossly normal neurologically.  Impression/Plan: Carol Jordan is here for an colonoscopy to be performed for colon cancer screening  Risks, benefits, limitations, and alternatives regarding  colonoscopy have been reviewed with the patient.  Questions have been answered.  All parties agreeable.   Lannette Donath, MD  08/16/2023, 7:33 AM

## 2023-08-16 NOTE — Anesthesia Preprocedure Evaluation (Addendum)
Anesthesia Evaluation  Patient identified by MRN, date of birth, ID band Patient awake    Reviewed: Allergy & Precautions, NPO status , Patient's Chart, lab work & pertinent test results  History of Anesthesia Complications Negative for: history of anesthetic complications  Airway Mallampati: III  TM Distance: <3 FB Neck ROM: full    Dental  (+) Chipped   Pulmonary neg pulmonary ROS, neg shortness of breath   Pulmonary exam normal        Cardiovascular Exercise Tolerance: Good (-) angina Normal cardiovascular exam+ dysrhythmias      Neuro/Psych negative neurological ROS  negative psych ROS   GI/Hepatic Neg liver ROS,GERD  Controlled,,  Endo/Other  negative endocrine ROS    Renal/GU negative Renal ROS  negative genitourinary   Musculoskeletal   Abdominal   Peds  Hematology negative hematology ROS (+)   Anesthesia Other Findings Past Medical History: No date: Allergic rhinitis No date: Allergy 2012: Cardiac arrhythmia     Comment:  work up by cardiology (sinus arrhythmia and bigeminy) No date: Chicken pox No date: GERD (gastroesophageal reflux disease) No date: UTI (urinary tract infection)  Past Surgical History: 2017: BREAST BIOPSY; Left     Comment:  fibroadenomas 08/02/2016: BREAST SURGERY; Left     Comment:  fibroadenomas 1972: HERNIA REPAIR 08/30/2012: INTRAUTERINE DEVICE (IUD) INSERTION     Comment:  [facility]     Reproductive/Obstetrics negative OB ROS                             Anesthesia Physical Anesthesia Plan  ASA: 2  Anesthesia Plan: General   Post-op Pain Management:    Induction: Intravenous  PONV Risk Score and Plan: Propofol infusion and TIVA  Airway Management Planned: Natural Airway and Nasal Cannula  Additional Equipment:   Intra-op Plan:   Post-operative Plan:   Informed Consent: I have reviewed the patients History and Physical, chart,  labs and discussed the procedure including the risks, benefits and alternatives for the proposed anesthesia with the patient or authorized representative who has indicated his/her understanding and acceptance.     Dental Advisory Given  Plan Discussed with: Anesthesiologist, CRNA and Surgeon  Anesthesia Plan Comments: (Patient consented for risks of anesthesia including but not limited to:  - adverse reactions to medications - risk of airway placement if required - damage to eyes, teeth, lips or other oral mucosa - nerve damage due to positioning  - sore throat or hoarseness - Damage to heart, brain, nerves, lungs, other parts of body or loss of life  Patient voiced understanding.)       Anesthesia Quick Evaluation

## 2023-08-16 NOTE — Op Note (Signed)
[facility] Patient Name: Carol Jordan Procedure Date: 08/16/2023 7:04 AM MRN: [id number] Account #: [id number] Date of Birth: March 25, 1966 Admit Type: Outpatient Age: 57 Room: [facility] ENDO ROOM 3 Gender: Female Note Status: Finalized Instrument Name: Nelda Marseille [id number] Procedure:             Colonoscopy Indications:           Screening for colorectal malignant neoplasm, This is                         the patient's first colonoscopy Providers:             Toney Reil MD, MD Referring MD:          No Local Md, MD (Referring MD) Medicines:             General Anesthesia Complications:         No immediate complications. Estimated blood loss: None. Procedure:             Pre-Anesthesia Assessment:                        - Prior to the procedure, a History and Physical was                         performed, and patient medications and allergies were                         reviewed. The patient is competent. The risks and                         benefits of the procedure and the sedation options and                         risks were discussed with the patient. All questions                         were answered and informed consent was obtained.                         Patient identification and proposed procedure were                         verified by the physician, the nurse, the                         anesthesiologist, the anesthetist and the technician                         in the pre-procedure area in the procedure room in the                         endoscopy suite. Mental Status Examination: alert and                         oriented. Airway Examination: normal oropharyngeal                         airway and neck mobility. Respiratory Examination:  clear to auscultation. CV Examination: normal.                         Prophylactic Antibiotics: The patient does not require                         prophylactic  antibiotics. Prior Anticoagulants: The                         patient has taken no anticoagulant or antiplatelet                         agents. ASA Grade Assessment: II - A patient with mild                         systemic disease. After reviewing the risks and                         benefits, the patient was deemed in satisfactory                         condition to undergo the procedure. The anesthesia                         plan was to use general anesthesia. Immediately prior                         to administration of medications, the patient was                         re-assessed for adequacy to receive sedatives. The                         heart rate, respiratory rate, oxygen saturations,                         blood pressure, adequacy of pulmonary ventilation, and                         response to care were monitored throughout the                         procedure. The physical status of the patient was                         re-assessed after the procedure.                        After obtaining informed consent, the colonoscope was                         passed under direct vision. Throughout the procedure,                         the patient's blood pressure, pulse, and oxygen                         saturations were monitored continuously. The  Colonoscope was introduced through the anus and                         advanced to the the terminal ileum, with                         identification of the appendiceal orifice and IC                         valve. The colonoscopy was performed without                         difficulty. The patient tolerated the procedure well.                         The quality of the bowel preparation was evaluated                         using the BBPS [facility] Bowel Preparation Scale) with                         scores of: Right Colon = 3, Transverse Colon = 3 and                         Left Colon = 3 (entire  mucosa seen well with no                         residual staining, small fragments of stool or opaque                         liquid). The total BBPS score equals 9. The terminal                         ileum, ileocecal valve, appendiceal orifice, and                         rectum were photographed. Findings:      The perianal and digital rectal examinations were normal. Pertinent       negatives include normal sphincter tone and no palpable rectal lesions.      The terminal ileum appeared normal.      A diminutive polyp was found in the transverse colon. The polyp was       sessile. The polyp was removed with a jumbo cold forceps. Resection and       retrieval were complete. Estimated blood loss: none.      A 4 mm polyp was found in the descending colon. The polyp was sessile.       The polyp was removed with a cold snare. Resection and retrieval were       complete. Estimated blood loss: none.      Non-bleeding external hemorrhoids were found during retroflexion. The       hemorrhoids were medium-sized. Impression:            - The examined portion of the ileum was normal.                        - One diminutive polyp in the transverse colon,  removed with a jumbo cold forceps. Resected and                         retrieved.                        - One 4 mm polyp in the descending colon, removed with                         a cold snare. Resected and retrieved.                        - Non-bleeding external hemorrhoids. Recommendation:        - Repeat colonoscopy in 5-10 years for surveillance                         based on pathology results.                        - Discharge patient to home (with escort).                        - Resume previous diet today.                        - Continue present medications. Procedure Code(s):     --- Professional ---                        [phone], Colonoscopy, flexible; with removal of                          tumor(s), polyp(s), or other lesion(s) by snare                         technique                        45380, 59, Colonoscopy, flexible; with biopsy, single                         or multiple Diagnosis Code(s):     --- Professional ---                        Z12.11, Encounter for screening for malignant neoplasm                         of colon                        K64.4, Residual hemorrhoidal skin tags                        D12.3, Benign neoplasm of transverse colon (hepatic                         flexure or splenic flexure)                        D12.4, Benign neoplasm of descending colon CPT copyright 2022 American Medical Association. All rights reserved. The codes documented in this report are  preliminary and upon coder review may  be revised to meet current compliance requirements. Dr. Libby Maw Toney Reil MD, MD 08/16/2023 8:05:11 AM This report has been signed electronically. Number of Addenda: 0 Note Initiated On: 08/16/2023 7:04 AM Scope Withdrawal Time: 0 hours 11 minutes 56 seconds  Total Procedure Duration: 0 hours 17 minutes 17 seconds  Estimated Blood Loss:  Estimated blood loss: none.      [facility]

## 2023-08-16 NOTE — Transfer of Care (Signed)
Immediate Anesthesia Transfer of Care Note  Patient: Carol Jordan  Procedure(s) Performed: COLONOSCOPY WITH PROPOFOL POLYPECTOMY  Patient Location: PACU  Anesthesia Type:MAC  Level of Consciousness: awake  Airway & Oxygen Therapy: Patient Spontanous Breathing  Post-op Assessment: Report given to RN and Post -op Vital signs reviewed and stable  Post vital signs: Reviewed and stable  Last Vitals:  Vitals Value Taken Time  BP 127/78 08/16/23 0807  Temp    Pulse 66 08/16/23 0807  Resp 17 08/16/23 0807  SpO2 99 % 08/16/23 0807  Vitals shown include unfiled device data.  Last Pain:  Vitals:   08/16/23 0807  TempSrc:   PainSc: 0-No pain         Complications: No notable events documented.

## 2023-08-16 NOTE — Anesthesia Postprocedure Evaluation (Signed)
Anesthesia Post Note  Patient: Carol Jordan  Procedure(s) Performed: COLONOSCOPY WITH PROPOFOL POLYPECTOMY  Patient location during evaluation: Endoscopy Anesthesia Type: General Level of consciousness: awake and alert Pain management: pain level controlled Vital Signs Assessment: post-procedure vital signs reviewed and stable Respiratory status: spontaneous breathing, nonlabored ventilation, respiratory function stable and patient connected to nasal cannula oxygen Cardiovascular status: blood pressure returned to baseline and stable Postop Assessment: no apparent nausea or vomiting Anesthetic complications: no   No notable events documented.   Last Vitals:  Vitals:   08/16/23 0807 08/16/23 0817  BP: 127/78 (!) 146/94  Pulse: 72 60  Resp: 15 12  Temp:    SpO2: 100% 100%    Last Pain:  Vitals:   08/16/23 0817  TempSrc:   PainSc: 0-No pain                 Cleda Mccreedy Toleen Lachapelle

## 2023-08-17 ENCOUNTER — Encounter: Payer: Self-pay | Admitting: Gastroenterology

## 2023-11-15 DIAGNOSIS — L578 Other skin changes due to chronic exposure to nonionizing radiation: Secondary | ICD-10-CM | POA: Diagnosis not present

## 2023-11-15 DIAGNOSIS — Z85828 Personal history of other malignant neoplasm of skin: Secondary | ICD-10-CM | POA: Diagnosis not present

## 2023-11-15 DIAGNOSIS — D2372 Other benign neoplasm of skin of left lower limb, including hip: Secondary | ICD-10-CM | POA: Diagnosis not present

## 2023-11-15 DIAGNOSIS — Z08 Encounter for follow-up examination after completed treatment for malignant neoplasm: Secondary | ICD-10-CM | POA: Diagnosis not present

## 2024-04-18 DIAGNOSIS — J019 Acute sinusitis, unspecified: Secondary | ICD-10-CM | POA: Diagnosis not present

## 2024-05-02 DIAGNOSIS — Z0189 Encounter for other specified special examinations: Secondary | ICD-10-CM | POA: Diagnosis not present

## 2024-05-07 DIAGNOSIS — E785 Hyperlipidemia, unspecified: Secondary | ICD-10-CM | POA: Diagnosis not present

## 2024-05-07 DIAGNOSIS — I10 Essential (primary) hypertension: Secondary | ICD-10-CM | POA: Diagnosis not present

## 2024-05-07 DIAGNOSIS — R7401 Elevation of levels of liver transaminase levels: Secondary | ICD-10-CM | POA: Diagnosis not present

## 2024-06-21 DIAGNOSIS — I10 Essential (primary) hypertension: Secondary | ICD-10-CM | POA: Diagnosis not present

## 2024-11-08 DIAGNOSIS — J0100 Acute maxillary sinusitis, unspecified: Secondary | ICD-10-CM | POA: Diagnosis not present

## 2024-11-15 DIAGNOSIS — D225 Melanocytic nevi of trunk: Secondary | ICD-10-CM | POA: Diagnosis not present

## 2024-11-15 DIAGNOSIS — D2262 Melanocytic nevi of left upper limb, including shoulder: Secondary | ICD-10-CM | POA: Diagnosis not present

## 2024-11-15 DIAGNOSIS — D2261 Melanocytic nevi of right upper limb, including shoulder: Secondary | ICD-10-CM | POA: Diagnosis not present

## 2024-11-15 DIAGNOSIS — D2272 Melanocytic nevi of left lower limb, including hip: Secondary | ICD-10-CM | POA: Diagnosis not present

## 2025-01-28 NOTE — Progress Notes (Unsigned)
 "  PCP: Patient, No Pcp Per   No chief complaint on file.   HPI:      [name] is a 59 y.o. G1P1001 whose LMP was No LMP recorded. Patient is perimenopausal., presents today for her annual examination.  Her menses are absent due to menopause. No PMB. Has occas vasomotor sx, taking estroven with sx relief.   Sex activity: single partner, contraception - post menopausal status. She does not have vaginal dryness. Takes macrobid  after sex for UTI prevention with sx relief, needs Rx RF.  Last Pap: 04/05/23 Results were: no abnormalities /neg HPV DNA. No hx of abn paps  Last mammogram: 05/06/23  Results were: normal--routine follow-up in 12 months There is no FH of breast cancer. There is no FH of ovarian cancer. The patient does do self-breast exams.  Colonoscopy: 8/24 at [facility] with polyp, repeat due after 7 yrs; neg Cologuard 3/21.  Tobacco use: The patient denies current or previous tobacco use. Alcohol use: none No drug use Exercise: moderately active  She does get adequate calcium and Vitamin D in her diet.  Labs at work.   Has had LT ear pressure for a few months. Takes allegra daily, uses nasal spray. Can't take pseudoephedrine due to heart palpitations with it in in past. Sees ENT, plans to schedule appt. No hearing changes.    Patient Active Problem List   Diagnosis Date Noted   Colon cancer screening 08/16/2023   Polyp of transverse colon 08/16/2023   Polyp of descending colon 08/16/2023   Osteopenia after menopause 05/14/2020   Vasomotor symptoms due to menopause 01/05/2020   Recurrent UTI 09/29/2017   Cardiac arrhythmia 09/29/2017   Allergic rhinitis 09/29/2017   Fibroadenoma of breast determined by biopsy 08/03/2016   Tick bite 05/17/2016   Chronic cough 05/17/2016   Pneumonitis 04/30/2016    Past Surgical History:  Procedure Laterality Date   BREAST BIOPSY Left 2017   fibroadenomas   BREAST SURGERY Left 08/02/2016   fibroadenomas    COLONOSCOPY WITH PROPOFOL  N/A 08/16/2023   Procedure: COLONOSCOPY WITH PROPOFOL ;  Surgeon: Unk Corinn Skiff, MD;  Location: [facility] ENDOSCOPY;  Service: Gastroenterology;  Laterality: N/A;   HERNIA REPAIR  1972   INTRAUTERINE DEVICE (IUD) INSERTION  08/30/2012   [facility]   POLYPECTOMY  08/16/2023   Procedure: POLYPECTOMY;  Surgeon: Unk Corinn Skiff, MD;  Location: [facility] ENDOSCOPY;  Service: Gastroenterology;;    Family History  Problem Relation Age of Onset   Heart disease Mother    Hypertension Mother    Stroke Maternal Grandmother    Parkinson's disease Maternal Grandmother    Breast cancer Neg Hx     Social History   Socioeconomic History   Marital status: Divorced    Spouse name: Not on file   Number of children: 1   Years of education: 16   Highest education level: Not on file  Occupational History   Occupation: nurse, children's  Tobacco Use   Smoking status: Never   Smokeless tobacco: Never  Vaping Use   Vaping status: Never Used  Substance and Sexual Activity   Alcohol use: Yes    Alcohol/week: 0.0 - 1.0 standard drinks of alcohol    Comment: OCC   Drug use: No   Sexual activity: Yes    Partners: Male  Other Topics Concern   Not on file  Social History Narrative   Not on file   Social Drivers of Health   Tobacco Use: Low Risk (  08/16/2023)   Patient History    Smoking Tobacco Use: Never    Smokeless Tobacco Use: Never    Passive Exposure: Not on file  Financial Resource Strain: Not on file  Food Insecurity: Not on file  Transportation Needs: Not on file  Physical Activity: Not on file  Stress: Not on file  Social Connections: Not on file  Intimate Partner Violence: Not on file  Depression (EYV7-0): Not on file  Alcohol Screen: Not on file  Housing: Not on file  Utilities: Not on file  Health Literacy: Not on file     Current Outpatient Medications:    Calcium Carb-Cholecalciferol (CALCIUM 500 + D3) 500-200 MG-UNIT TABS, Take 1 tablet by mouth  daily., Disp: , Rfl:    fexofenadine (ALLEGRA) 180 MG tablet, TAKE 1 TABLET (180 MG) BY ORAL ROUTE ONCE DAILY, Disp: , Rfl: 3   fluticasone (FLONASE) 50 MCG/ACT nasal spray, SPRAY 2 SPRAYS IN EACH NOSTRIL ONCE DAILY, Disp: , Rfl:    Multiple Vitamins-Minerals (ALIVE ONCE DAILY WOMENS 50+) TABS, Take 1 tablet by mouth daily., Disp: , Rfl:    nitrofurantoin , macrocrystal-monohydrate, (MACROBID ) 100 MG capsule, Take one after intercourse, Disp: 30 capsule, Rfl: 2     ROS:  Review of Systems  Constitutional:  Negative for fatigue, fever and unexpected weight change.  Respiratory:  Negative for cough, shortness of breath and wheezing.   Cardiovascular:  Negative for chest pain, palpitations and leg swelling.  Gastrointestinal:  Negative for blood in stool, constipation, diarrhea, nausea and vomiting.  Endocrine: Negative for cold intolerance, heat intolerance and polyuria.  Genitourinary:  Negative for dyspareunia, dysuria, flank pain, frequency, genital sores, hematuria, menstrual problem, pelvic pain, urgency, vaginal bleeding, vaginal discharge and vaginal pain.  Musculoskeletal:  Negative for back pain, joint swelling and myalgias.  Skin:  Negative for rash.  Neurological:  Negative for dizziness, syncope, light-headedness, numbness and headaches.  Hematological:  Negative for adenopathy.  Psychiatric/Behavioral:  Negative for agitation, confusion, sleep disturbance and suicidal ideas. The patient is not nervous/anxious.    BREAST: No symptoms    Objective: There were no vitals taken for this visit.   Physical Exam Constitutional:      Appearance: She is well-developed.  Genitourinary:     Vulva normal.     Right Labia: No rash, tenderness or lesions.    Left Labia: No tenderness, lesions or rash.    No vaginal discharge, erythema or tenderness.      Right Adnexa: not tender and no mass present.    Left Adnexa: not tender and no mass present.    No cervical friability or  polyp.     Uterus is not enlarged or tender.  Breasts:    Right: No mass, nipple discharge, skin change or tenderness.     Left: No mass, nipple discharge, skin change or tenderness.  HENT:     Right Ear: Hearing, tympanic membrane, ear canal and external ear normal.     Left Ear: Hearing, tympanic membrane, ear canal and external ear normal. There is no impacted cerumen. Tympanic membrane is not injected, perforated, erythematous or retracted.  Neck:     Thyroid: No thyromegaly.  Cardiovascular:     Rate and Rhythm: Normal rate and regular rhythm.     Heart sounds: Normal heart sounds. No murmur heard. Pulmonary:     Effort: Pulmonary effort is normal.     Breath sounds: Normal breath sounds.  Abdominal:     Palpations: Abdomen is soft.  Tenderness: There is no abdominal tenderness. There is no guarding or rebound.  Musculoskeletal:        General: Normal range of motion.     Cervical back: Normal range of motion.  Lymphadenopathy:     Cervical: No cervical adenopathy.  Neurological:     General: No focal deficit present.     Mental Status: She is alert and oriented to person, place, and time.     Cranial Nerves: No cranial nerve deficit.  Skin:    General: Skin is warm and dry.  Psychiatric:        Mood and Affect: Mood normal.        Behavior: Behavior normal.        Thought Content: Thought content normal.        Judgment: Judgment normal.  Vitals reviewed.    Assessment/Plan:  Encounter for annual routine gynecological examination  Cervical cancer screening - Plan: Cytology - PAP  Screening for HPV (human papillomavirus) - Plan: Cytology - PAP  Encounter for screening mammogram for malignant neoplasm of breast - Plan: MM 3D SCREENING MAMMOGRAM BILATERAL BREAST; pt to schedule mammo  Screening for colon cancer - Plan: Ambulatory referral to Gastroenterology; refer to GI for scr colonoscopy  Postcoital UTI - Plan: nitrofurantoin , macrocrystal-monohydrate,  (MACROBID ) 100 MG capsule; Rx RF  Left ear pressure--neg exam. Try coricidin HBP for safer decongestant given pt's heart palpitations with pseudoephedrine. F/u with ENT.    No orders of the defined types were placed in this encounter.           GYN counsel breast self exam, mammography screening, adequate intake of calcium and vitamin D, diet and exercise    F/U  No follow-ups on file.  Keimya Briddell B. Frenchie Pribyl, PA-C 01/28/2025 10:04 AM "

## 2025-01-31 ENCOUNTER — Other Ambulatory Visit: Payer: Self-pay | Admitting: Obstetrics and Gynecology

## 2025-01-31 ENCOUNTER — Ambulatory Visit: Admitting: Obstetrics and Gynecology

## 2025-01-31 ENCOUNTER — Encounter: Payer: Self-pay | Admitting: Obstetrics and Gynecology

## 2025-01-31 VITALS — BP 116/74 | HR 81 | Ht 66.0 in | Wt 171.0 lb

## 2025-01-31 DIAGNOSIS — N951 Menopausal and female climacteric states: Secondary | ICD-10-CM

## 2025-01-31 DIAGNOSIS — N390 Urinary tract infection, site not specified: Secondary | ICD-10-CM

## 2025-01-31 DIAGNOSIS — Z01419 Encounter for gynecological examination (general) (routine) without abnormal findings: Secondary | ICD-10-CM

## 2025-01-31 DIAGNOSIS — Z0000 Encounter for general adult medical examination without abnormal findings: Secondary | ICD-10-CM

## 2025-01-31 DIAGNOSIS — Z1231 Encounter for screening mammogram for malignant neoplasm of breast: Secondary | ICD-10-CM

## 2025-01-31 MED ORDER — NITROFURANTOIN MONOHYD MACRO 100 MG PO CAPS: ORAL_CAPSULE | ORAL | 2 refills | Status: DC

## 2025-01-31 NOTE — Patient Instructions (Signed)
 I value your feedback and you entrusting [place] with your care. If you get a [facility] patient survey, I would appreciate you taking the time to let us know about your experience today. Thank you!  [facility] ([place])--[phone]

## 2025-02-01 LAB — COMPREHENSIVE METABOLIC PANEL WITH GFR
ALT: 23 [IU]/L (ref 0–32)
AST: 20 [IU]/L (ref 0–40)
Albumin: 4.6 g/dL (ref 3.8–4.9)
Alkaline Phosphatase: 60 [IU]/L (ref 49–135)
BUN/Creatinine Ratio: 18 (ref 9–23)
BUN: 13 mg/dL (ref 6–24)
Bilirubin Total: 0.4 mg/dL (ref 0.0–1.2)
CO2: 21 mmol/L (ref 20–29)
Calcium: 9.6 mg/dL (ref 8.7–10.2)
Chloride: 108 mmol/L — ABNORMAL HIGH (ref 96–106)
Creatinine, Ser: 0.73 mg/dL (ref 0.57–1.00)
Globulin, Total: 2.4 g/dL (ref 1.5–4.5)
Glucose: 92 mg/dL (ref 70–99)
Potassium: 4.2 mmol/L (ref 3.5–5.2)
Sodium: 144 mmol/L (ref 134–144)
Total Protein: 7 g/dL (ref 6.0–8.5)
eGFR: 95 mL/min/{1.73_m2}

## 2025-02-21 ENCOUNTER — Encounter
